# Patient Record
Sex: Male | Born: 1950 | Race: White | Hispanic: No | Marital: Married | Smoking: Former smoker
Health system: Southern US, Community
[De-identification: ages and names within clinical notes are randomized; demographics above are authoritative.]

## PROBLEM LIST (undated history)

## (undated) DIAGNOSIS — Z9109 Other allergy status, other than to drugs and biological substances: Secondary | ICD-10-CM

## (undated) DIAGNOSIS — G4733 Obstructive sleep apnea (adult) (pediatric): Secondary | ICD-10-CM

## (undated) DIAGNOSIS — T7840XA Allergy, unspecified, initial encounter: Secondary | ICD-10-CM

## (undated) DIAGNOSIS — T8859XA Other complications of anesthesia, initial encounter: Secondary | ICD-10-CM

## (undated) DIAGNOSIS — Z86010 Personal history of colonic polyps: Principal | ICD-10-CM

## (undated) DIAGNOSIS — G4730 Sleep apnea, unspecified: Secondary | ICD-10-CM

## (undated) HISTORY — DX: Sleep apnea, unspecified: G47.30

## (undated) HISTORY — PX: WRIST SURGERY: SHX841

## (undated) HISTORY — DX: Allergy, unspecified, initial encounter: T78.40XA

## (undated) HISTORY — DX: Personal history of colonic polyps: Z86.010

## (undated) HISTORY — PX: EYE SURGERY: SHX253

## (undated) HISTORY — DX: Obstructive sleep apnea (adult) (pediatric): G47.33

## (undated) HISTORY — PX: COLONOSCOPY: SHX174

## (undated) HISTORY — DX: Other allergy status, other than to drugs and biological substances: Z91.09

## (undated) HISTORY — PX: FRACTURE SURGERY: SHX138

## (undated) HISTORY — PX: COLON SURGERY: SHX602

---

## 1985-01-25 HISTORY — PX: NASAL SEPTUM SURGERY: SHX37

## 2001-04-20 ENCOUNTER — Ambulatory Visit: Admission: RE | Admit: 2001-04-20 | Discharge: 2001-04-20 | Payer: Self-pay | Admitting: Gastroenterology

## 2001-04-20 ENCOUNTER — Encounter (INDEPENDENT_AMBULATORY_CARE_PROVIDER_SITE_OTHER): Payer: Self-pay | Admitting: Specialist

## 2008-09-04 ENCOUNTER — Encounter: Payer: Self-pay | Admitting: Internal Medicine

## 2008-09-18 ENCOUNTER — Encounter: Payer: Self-pay | Admitting: Internal Medicine

## 2008-09-19 ENCOUNTER — Telehealth (INDEPENDENT_AMBULATORY_CARE_PROVIDER_SITE_OTHER): Payer: Self-pay | Admitting: *Deleted

## 2010-06-12 NOTE — Procedures (Signed)
[facility]  Patient:    Michael Joseph, Michael Joseph Visit Number: [id number] MRN: [id number]          Service Type: Attending:  Petra Kuba, M.D. Dictated by:   Petra Kuba, M.D. Proc. Date: 04/20/01   CC:         Mr. Montey Hora, [place], [place].  Dr. Christell Constant   Procedure Report  PROCEDURE:  Colonoscopy with biopsy.  INDICATIONS FOR PROCEDURE:  Mild change in bowel habits actually better with fiber and some bright red blood per rectum probably due to hemorrhoids.  Consent was signed after risks, benefits, methods, and options were thoroughly discussed in the office.  MEDICINES USED:  Demerol 60, Versed 7.  DESCRIPTION OF PROCEDURE:  Rectal inspection is pertinent for external hemorrhoids, small. The digital exam was negative. The video colonoscope was inserted and easily advanced around the colon to the cecum. On insertion, no abnormalities were seen. The cecum was identified by the ileocecal valve and the appendiceal orifice. In fact, the scope was inserted a short ways into the terminal ileum which was normal. Photo documentation was obtained. The scope was slowly withdrawn. The prep was adequate. There was some liquid stool that required washing and suctioning but on slow withdrawal through the colon adequate visualization was seen. The cecum and ascending were normal. In the mid transverse, a questionable tiny hyperplastic appearing polyp was seen and was cold biopsied x1. The scope was further withdrawn. On the left side of the colon were some very early occasional scattered diverticula. As the scope was withdrawn around the left side of the colon, no other abnormalities were seen. In the rectum, a tiny probably hyperplastic appearing polyp was seen and cold biopsied and put in the same container. The scope was retroflexed back in the rectum pertinent for some internal hemorrhoids. The scope was straightened, readvanced a short ways around the left  side of the colon, air was suctioned, scope removed. The patient tolerated the procedure well. There was on obvious or immediate complication.  ENDOSCOPIC DIAGNOSIS:  1. Internal/external hemorrhoids.  2. Two tiny hyperplastic appearing rectal and transverse polyps cold     biopsied.  3. Very early left sided occasional diverticula.  4. Otherwise within normal limits to the cecum and the terminal ileum.  PLAN:  Await pathology, probably recheck colon screening in five years. Happy to see back p.r.n. otherwise return care to Mr. Webster, Dr. Christell Constant for the customary health care maintenance to include yearly rectals and guaiacs. Dictated by:   Petra Kuba, M.D. Attending:  Petra Kuba, M.D. DD:  04/20/01 TD:  04/21/01 Job: [phone] [id number]

## 2011-03-24 ENCOUNTER — Emergency Department (HOSPITAL_COMMUNITY): Payer: BC Managed Care – PPO

## 2011-03-24 ENCOUNTER — Encounter (HOSPITAL_COMMUNITY): Payer: Self-pay | Admitting: *Deleted

## 2011-03-24 ENCOUNTER — Emergency Department (HOSPITAL_COMMUNITY)
Admission: EM | Admit: 2011-03-24 | Discharge: 2011-03-24 | Disposition: A | Discharge status: Home / Self Care | Payer: BC Managed Care – PPO | Attending: Emergency Medicine | Admitting: Emergency Medicine

## 2011-03-24 DIAGNOSIS — Y92838 Other recreation area as the place of occurrence of the external cause: Secondary | ICD-10-CM | POA: Insufficient documentation

## 2011-03-24 DIAGNOSIS — W010XXA Fall on same level from slipping, tripping and stumbling without subsequent striking against object, initial encounter: Secondary | ICD-10-CM | POA: Insufficient documentation

## 2011-03-24 DIAGNOSIS — S5290XA Unspecified fracture of unspecified forearm, initial encounter for closed fracture: Secondary | ICD-10-CM | POA: Insufficient documentation

## 2011-03-24 DIAGNOSIS — S62102A Fracture of unspecified carpal bone, left wrist, initial encounter for closed fracture: Secondary | ICD-10-CM

## 2011-03-24 DIAGNOSIS — Y9239 Other specified sports and athletic area as the place of occurrence of the external cause: Secondary | ICD-10-CM | POA: Insufficient documentation

## 2011-03-24 IMAGING — CR DG WRIST COMPLETE 3+V*L*
4 series · 4 of 4 positions shown · non-contrast
Comparison: None.

CLINICAL DATA: Left wrist pain and swelling.  Status post fall.

LEFT WRIST - COMPLETE 3+ VIEW

[x wrist pa left]
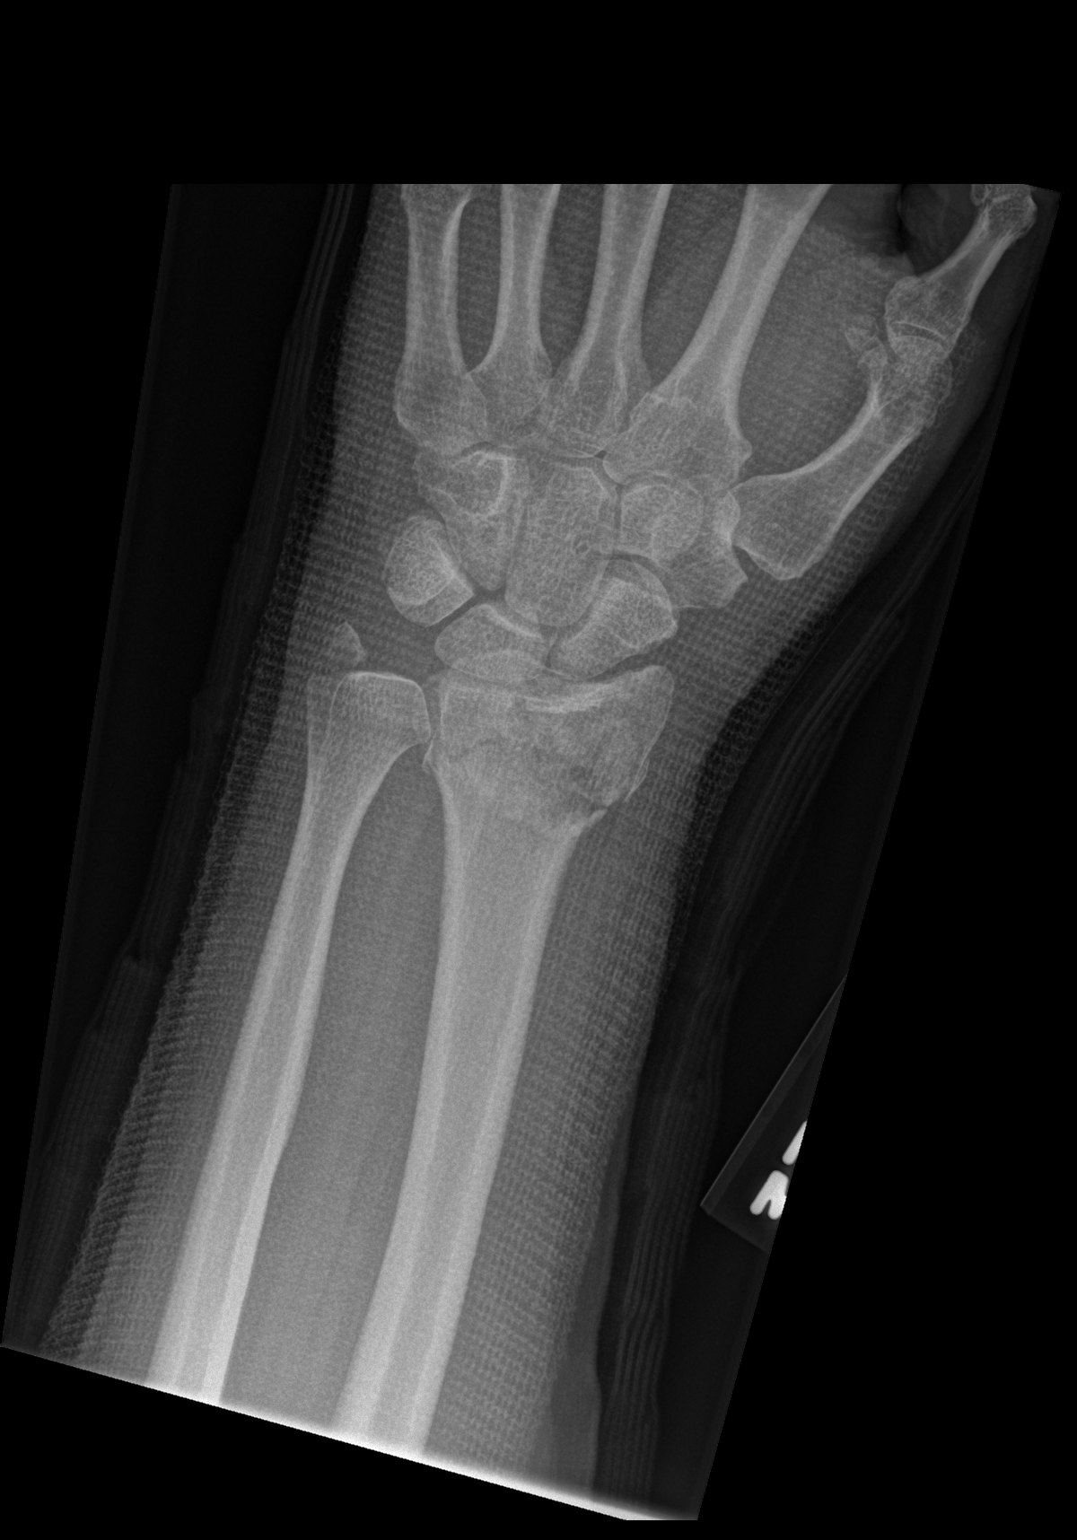

[x wrist obl left]
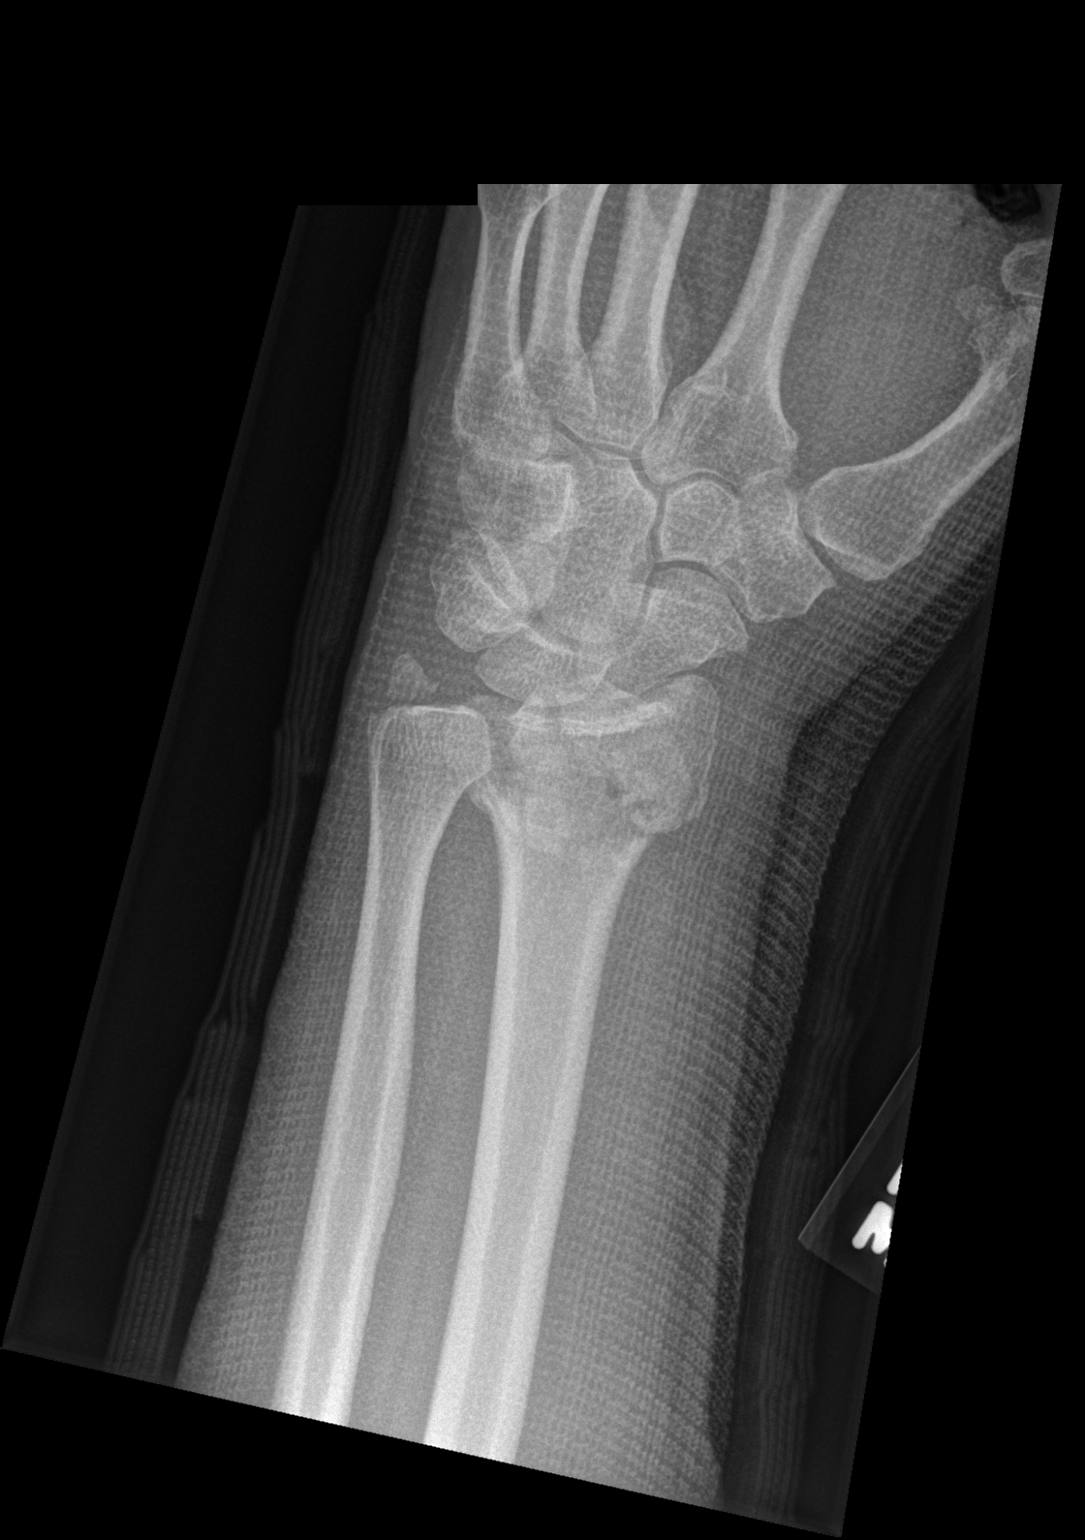

[x wrist lat left]
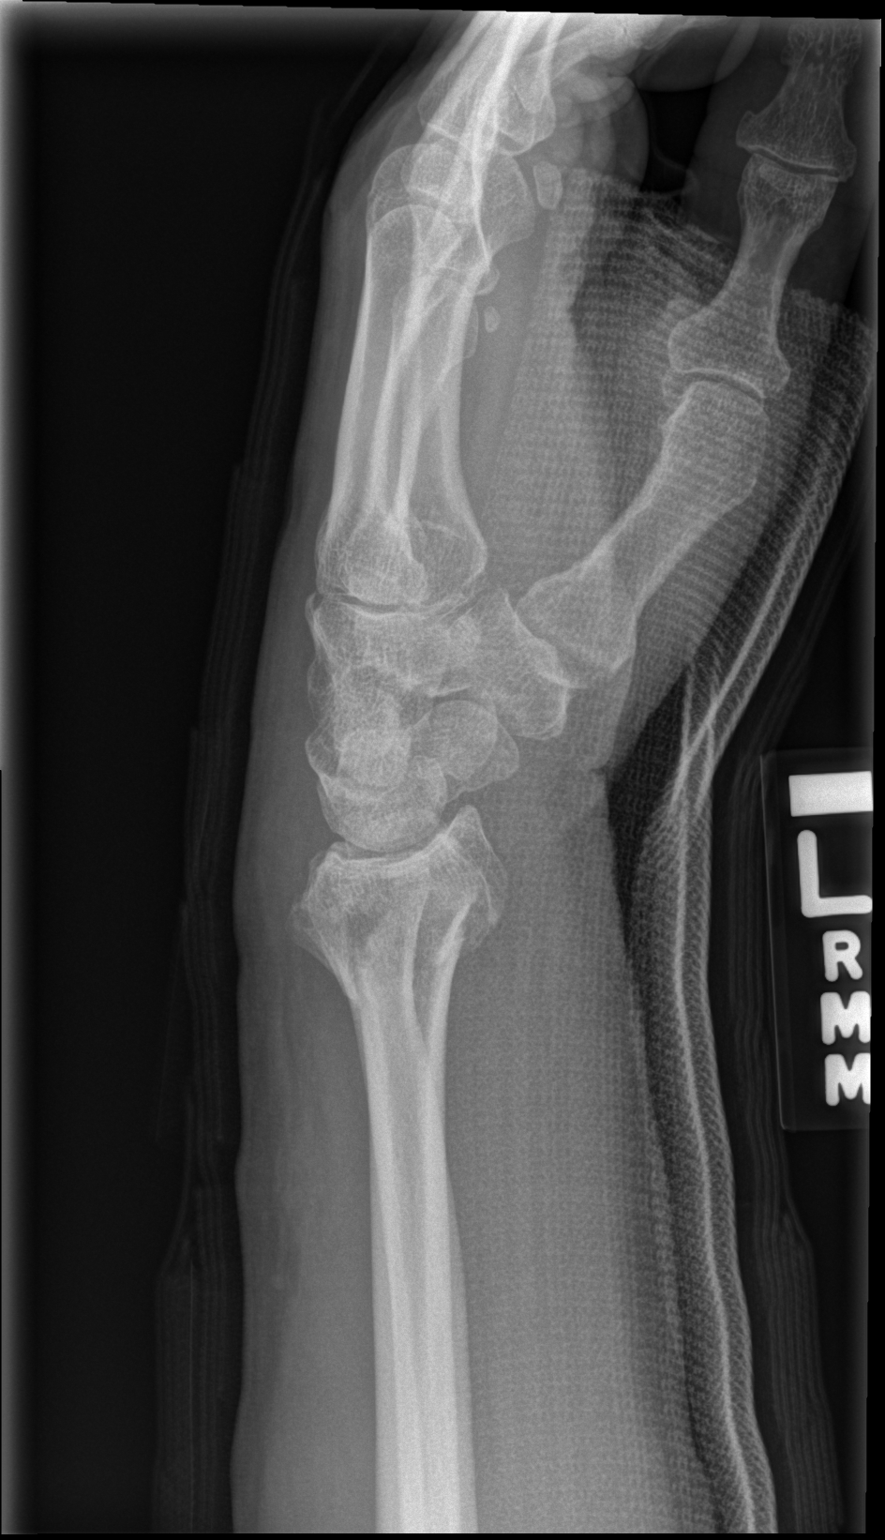

[x wrist navicular view left]
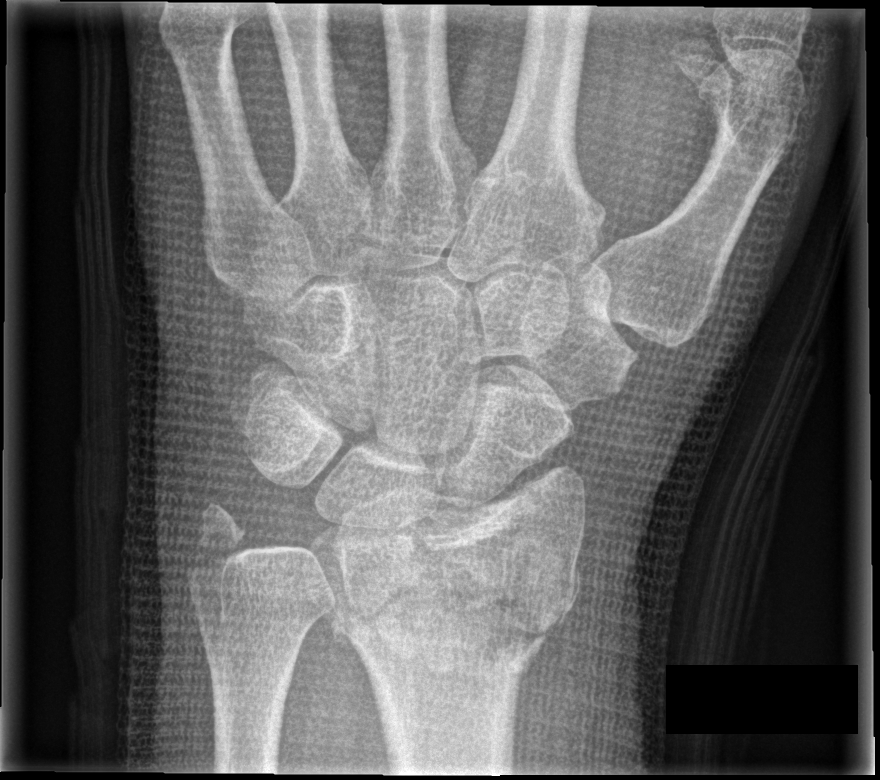

[4 of 4 positions shown; findings below may reference images not displayed]

FINDINGS: There is a significantly comminuted fracture involving
the distal radial metaphysis, with some degree of impaction.
Multiple fracture lines extend to the radiocarpal joint.  There is
only mild displacement of associated fragments, without significant
angulation.  A mildly displaced ulnar styloid fracture is also
seen.

No additional fractures are identified.  The carpal rows appear
grossly intact, and demonstrate normal alignment.  The soft tissues
are difficult to fully characterize due to the overlying bandages.
IMPRESSION: 1.  Significantly comminuted fracture involving the distal radial
metaphysis, with some degree of impaction; mild displacement of
associated fragments, without significant angulation.
2.  Mildly displaced ulnar styloid fracture noted.

## 2011-03-24 MED ORDER — OXYCODONE-ACETAMINOPHEN 5-325 MG PO TABS: 1.0000 | ORAL_TABLET | Freq: Once | ORAL | Status: AC

## 2011-03-24 MED ORDER — OXYCODONE-ACETAMINOPHEN 5-325 MG PO TABS
1.0000 | ORAL_TABLET | Freq: Once | ORAL | Status: AC
  Administered 2011-03-24: 1 via ORAL
  Filled 2011-03-24: qty 1

## 2011-03-24 NOTE — Discharge Instructions (Signed)
Wrist Fracture A wrist fracture is when one or more wrist bones are broken (fractured). A cast or splint is used to keep the injured bones from moving. The cast or splint is often on for 4 to 6 weeks. HOME CARE  Keep the wrist raised (elevated) when sitting or lying down.   Do not wear rings or jewelry on the injured hand or wrist.   Put ice on the injured area.   Put ice in a plastic bag.   Place a towel between your skin and the bag.   Leave the ice on for 15 to 20 minutes, 3 to 4 times a day.   If you are given a plaster or fiberglasscast:   Do not try to scratch the skin under the cast with sharp objects.   Check the skin around the cast every day. You may put lotion on any red or sore areas. Keep the cast dry and clean.   If you are given a plastersplint:   Wear it as told. You may loosen the elastic around the splint if the fingers get numb, tingle, or turn a little blue.   If you are given a removable splint, wear it as told.   Do not use powders or deodorants in or around the cast or splint.   Do not remove padding from your cast or splint.   Do not put pressure on any part of the cast or splint. It may break. Rest the cast or splint on a pillow for the first 24 hours until it is fully hardened.   Gently move your fingers so they do not get stiff.   Do not remove the splint unless your doctor tells you to. Casts must be removed by a doctor.   Only take medicine as told by your doctor.   See your doctor again within 1 week to make sure you are healing well. If you have a splint, you may need a cast after the puffiness (swelling) goes down.  GET HELP RIGHT AWAY IF:   The cast or splint gets damaged or breaks.   The pain gets worse and is not helped by medicine.   There is more puffiness than there was before the cast was put on.   The skin or nails turn blue or gray or feel cold or numb.   The cast or splint feels too tight or too loose.   You have trouble  moving or feeling your fingers.   You have a burning or stinging feeling from the cast or splint.   There is a bad smell coming from the cast or splint.   New stains or fluids are coming from under the cast or splint.   You have any new injuries while wearing the cast or splint.  MAKE SURE YOU:   Understand these instructions.   Will watch your condition.   Will get help right away if you are not doing well or get worse.  Document Released: 06/30/2007 Document Revised: 09/23/2010 Document Reviewed: 06/11/2009 Exit[employer] Patient Information 2012 [employer]. 

## 2011-03-24 NOTE — ED Provider Notes (Signed)
History     CSN: [id number]  Arrival date & time 03/24/11  1836   First MD Initiated Contact with Patient 03/24/11 2156      Chief Complaint  Patient presents with  . Arm Injury    (Consider location/radiation/quality/duration/timing/severity/associated sxs/prior treatment) HPI Comments: Patient slipped by playing golf, catching himself with an outstretched left hand.  Now he has a fracture of the distal radius and ulnar sent from [facility] with a "dislocated wrist" to see Dr. Melvyn Novas from [facility] orthopedics.  He has a ice pack and Ace bandage and sling in place  Patient is a 61 y.o. male presenting with arm injury. The history is provided by the patient.  Arm Injury  The incident occurred today. The injury mechanism was a fall. The injury was related to sports.    History reviewed. No pertinent past medical history.  History reviewed. No pertinent past surgical history.  History reviewed. No pertinent family history.  History  Substance Use Topics  . Smoking status: Not on file  . Smokeless tobacco: Not on file  . Alcohol Use: No      Review of Systems  Constitutional: Negative for chills.  Musculoskeletal: Positive for joint swelling. Negative for back pain.  Skin: Negative for wound.    Allergies  Amoxicillin  Home Medications   Current Outpatient Rx  Name Route Sig Dispense Refill  . FLUTICASONE PROPIONATE 50 MCG/ACT NA SUSP Nasal Place 1 spray into the nose daily.    Marland Kitchen LORATADINE-PSEUDOEPHEDRINE ER 5-120 MG PO TB12 Oral Take 1 tablet by mouth 2 (two) times daily.    . OXYCODONE-ACETAMINOPHEN 5-325 MG PO TABS Oral Take 1 tablet by mouth once. 30 tablet 0    BP 126/78  Pulse 76  Temp(Src) 97.5 F (36.4 C) (Oral)  Resp 20  SpO2 96%  Physical Exam  Constitutional: He appears well-developed and well-nourished.  HENT:  Head: Normocephalic.  Eyes: Pupils are equal, round, and reactive to light.  Neck: Normal range of motion.    Pulmonary/Chest: Effort normal.  Musculoskeletal:       Left forearm and hand in case splint with sling has full range of motion of fingers.  No numbness or tingling.  Refill is less than 3 seconds.  Full range of motion at elbow.  X-ray reveals a comminuted, fracture of the distal radius -- chip fracture of the distal ulnar styloid dressing  Neurological: He is alert.  Skin: Skin is warm and dry.    ED Course  Procedures (including critical care time)  Labs Reviewed - No data to display Dg Wrist Complete Left  03/24/2011  *RADIOLOGY REPORT*  Clinical Data: Left wrist pain and swelling.  Status post fall.  LEFT WRIST - COMPLETE 3+ VIEW  Comparison: None.  Findings: There is a significantly comminuted fracture involving the distal radial metaphysis, with some degree of impaction. Multiple fracture lines extend to the radiocarpal joint.  There is only mild displacement of associated fragments, without significant angulation.  A mildly displaced ulnar styloid fracture is also seen.  No additional fractures are identified.  The carpal rows appear grossly intact, and demonstrate normal alignment.  The soft tissues are difficult to fully characterize due to the overlying bandages.  IMPRESSION:  1.  Significantly comminuted fracture involving the distal radial metaphysis, with some degree of impaction; mild displacement of associated fragments, without significant angulation. 2.  Mildly displaced ulnar styloid fracture noted.  Original Report Authenticated By: Tonia Ghent, M.D.  1. Left wrist fracture     I consulted with Dr. Melvyn Novas, who looked at the x-ray from him.  The beats.  No immediate action at this time.  I will place the patient in a ulnar gutter splint.  Continue pain control and sling and patient will followup with Dr. Melvyn Novas tomorrow in the office  MDM  Comminuted distal left radius fracture and small chip fracture of the distal left ulnar styloid        Arman Filter,  NP 03/24/11 2323  Arman Filter, NP 03/24/11 2324

## 2011-03-24 NOTE — ED Notes (Signed)
Pt seen by EDNP prior to RN assessment, consult with ortho initiated, pt now back from xray, pending results, orders received & initiated, wife at [facility], pain med given. Pain increases with movement, rates 5/10 when still, CMS intact, cap refill <3sec.

## 2011-03-24 NOTE — ED Notes (Signed)
Pt sent here from a [facility], had fall earlier today and has dislocated left wrist, sent here for ortho consult. Has splint and sling applied pta. Cms intact, able to move fingers.

## 2011-03-25 NOTE — ED Provider Notes (Signed)
Medical screening examination/treatment/procedure(s) were performed by non-physician practitioner and as supervising physician I was immediately available for consultation/collaboration.  Raeford Razor, MD 03/25/11 [phone]

## 2012-06-28 ENCOUNTER — Other Ambulatory Visit: Payer: Self-pay | Admitting: Family Medicine

## 2012-06-29 MED ORDER — FLUTICASONE PROPIONATE 50 MCG/ACT NA SUSP: 1.0000 | Freq: Every day | NASAL | Status: DC

## 2012-06-29 NOTE — Telephone Encounter (Signed)
Mail order, call wife at [phone] for pick up

## 2012-12-12 ENCOUNTER — Other Ambulatory Visit: Payer: Self-pay | Admitting: Nurse Practitioner

## 2012-12-14 ENCOUNTER — Telehealth: Payer: Self-pay | Admitting: Nurse Practitioner

## 2012-12-14 MED ORDER — FLUTICASONE PROPIONATE 50 MCG/ACT NA SUSP: 1.0000 | Freq: Every day | NASAL | Status: DC

## 2012-12-14 NOTE — Telephone Encounter (Signed)
Patient aware.

## 2012-12-14 NOTE — Telephone Encounter (Signed)
rx ready for pickup 

## 2012-12-14 NOTE — Telephone Encounter (Signed)
He wants 90 day rx to pickup, last seen 10/22/11 !

## 2013-06-12 ENCOUNTER — Encounter (INDEPENDENT_AMBULATORY_CARE_PROVIDER_SITE_OTHER): Payer: Self-pay

## 2013-06-12 ENCOUNTER — Ambulatory Visit (INDEPENDENT_AMBULATORY_CARE_PROVIDER_SITE_OTHER): Payer: BC Managed Care – PPO | Admitting: Physician Assistant

## 2013-06-12 ENCOUNTER — Encounter: Payer: Self-pay | Admitting: Physician Assistant

## 2013-06-12 VITALS — BP 122/64 | HR 70 | Temp 98.0°F | Ht 67.5 in | Wt 182.4 lb

## 2013-06-12 DIAGNOSIS — Z0000 Encounter for general adult medical examination without abnormal findings: Secondary | ICD-10-CM

## 2013-06-12 DIAGNOSIS — J302 Other seasonal allergic rhinitis: Secondary | ICD-10-CM

## 2013-06-12 DIAGNOSIS — G4733 Obstructive sleep apnea (adult) (pediatric): Secondary | ICD-10-CM | POA: Insufficient documentation

## 2013-06-12 NOTE — Patient Instructions (Signed)

## 2013-06-12 NOTE — Progress Notes (Signed)
Subjective:     Patient ID: Michael Joseph, male   DOB: 07-02-1950, 63 y.o.   MRN: [id number]  HPI Pt needing PE Pt states it has been ~ 5yrs since his last PE visit Voices no concerns or worries  Review of Systems  Constitutional: Negative.   HENT: Negative.   Eyes: Negative.   Respiratory: Negative.   Cardiovascular: Negative.   Gastrointestinal: Negative.   Endocrine: Negative.   Genitourinary: Negative.   Musculoskeletal: Negative.   Skin: Negative.   Allergic/Immunologic: Positive for environmental allergies.  Neurological: Negative.   Hematological: Negative.   Psychiatric/Behavioral: Negative.        Objective:   Physical Exam  Vitals reviewed. Constitutional: He is oriented to person, place, and time. He appears well-developed and well-nourished. No distress.  HENT:  Head: Normocephalic and atraumatic.  Right Ear: External ear normal.  Left Ear: External ear normal.  Nose: Nose normal.  Mouth/Throat: Oropharynx is clear and moist.  Eyes: Conjunctivae and EOM are normal. Pupils are equal, round, and reactive to light.  Neck: Normal range of motion. Neck supple. No JVD present. No thyromegaly present.  Cardiovascular: Normal rate, regular rhythm, normal heart sounds and intact distal pulses.   Pulmonary/Chest: Effort normal and breath sounds normal.  Abdominal: Soft. Bowel sounds are normal. He exhibits no distension and no mass. There is tenderness.  Genitourinary: Penis normal.  Testes down  no ing nodes  Musculoskeletal: Normal range of motion. He exhibits no edema and no tenderness.  Lymphadenopathy:    He has no cervical adenopathy.  Neurological: He is alert and oriented to person, place, and time. He has normal reflexes.  Skin: Skin is warm and dry.  Erythem rash bilat to lower legs Pt with freckles to entire torso Mult small seb keratosis noted to back  Psychiatric: He has a normal mood and affect. His behavior is normal. Judgment and thought  content normal.       Assessment:     Physical exam    Plan:     Pt to continue with good diet and exercise Offered to update tetanus but pt defers Full labs pending- will inform of results Pt needing eye exam but up to date with dentist Pt also overdue for colonoscopy recheck- referral done today F/U pending labs

## 2013-06-13 LAB — CMP14+EGFR
A/G RATIO: 1.8 (ref 1.1–2.5)
ALT: 23 IU/L (ref 0–44)
AST: 22 IU/L (ref 0–40)
Albumin: 4.1 g/dL (ref 3.6–4.8)
Alkaline Phosphatase: 78 IU/L (ref 39–117)
BILIRUBIN TOTAL: 0.5 mg/dL (ref 0.0–1.2)
BUN/Creatinine Ratio: 14 (ref 10–22)
BUN: 12 mg/dL (ref 8–27)
CO2: 22 mmol/L (ref 18–29)
CREATININE: 0.83 mg/dL (ref 0.76–1.27)
Calcium: 9.1 mg/dL (ref 8.6–10.2)
Chloride: 105 mmol/L (ref 97–108)
GFR, EST AFRICAN AMERICAN: 110 mL/min/{1.73_m2} (ref >59)
GFR, EST NON AFRICAN AMERICAN: 95 mL/min/{1.73_m2} (ref >59)
GLOBULIN, TOTAL: 2.3 g/dL (ref 1.5–4.5)
Glucose: 108 mg/dL — ABNORMAL HIGH (ref 65–99)
POTASSIUM: 4.4 mmol/L (ref 3.5–5.2)
SODIUM: 139 mmol/L (ref 134–144)
TOTAL PROTEIN: 6.4 g/dL (ref 6.0–8.5)

## 2013-06-13 LAB — LIPID PANEL
CHOL/HDL RATIO: 6.1 ratio — AB (ref 0.0–5.0)
Cholesterol, Total: 196 mg/dL (ref 100–199)
HDL: 32 mg/dL — ABNORMAL LOW (ref >39)
LDL Calculated: 102 mg/dL — ABNORMAL HIGH (ref 0–99)
Triglycerides: 308 mg/dL — ABNORMAL HIGH (ref 0–149)
VLDL CHOLESTEROL CAL: 62 mg/dL — AB (ref 5–40)

## 2013-06-13 LAB — PSA, TOTAL AND FREE
PSA, Free Pct: 23.3 %
PSA, Free: 0.63 ng/mL
PSA: 2.7 ng/mL (ref 0.0–4.0)

## 2013-06-13 LAB — HEPATIC FUNCTION PANEL: BILIRUBIN DIRECT: 0.14 mg/dL (ref 0.00–0.40)

## 2013-06-27 ENCOUNTER — Encounter: Payer: Self-pay | Admitting: Internal Medicine

## 2013-08-13 ENCOUNTER — Ambulatory Visit (AMBULATORY_SURGERY_CENTER): Payer: Self-pay

## 2013-08-13 VITALS — Ht 68.0 in | Wt 180.0 lb

## 2013-08-13 DIAGNOSIS — Z860100 Personal history of colon polyps, unspecified: Secondary | ICD-10-CM

## 2013-08-13 MED ORDER — SUPREP BOWEL PREP KIT 17.5-3.13-1.6 GM/177ML PO SOLN: 1.0000 | Freq: Once | ORAL | Status: DC

## 2013-08-13 NOTE — Progress Notes (Signed)
No allergies to eggs or soy No home oxygen No past problem with anesthesia No diet/weight loss meds  Has email  Emmi instructions given for colonoscopy

## 2013-08-28 ENCOUNTER — Encounter: Payer: Self-pay | Admitting: Internal Medicine

## 2013-08-28 ENCOUNTER — Ambulatory Visit (AMBULATORY_SURGERY_CENTER): Payer: BC Managed Care – PPO | Admitting: Internal Medicine

## 2013-08-28 ENCOUNTER — Encounter: Payer: BC Managed Care – PPO | Admitting: Internal Medicine

## 2013-08-28 VITALS — BP 99/62 | HR 60 | Temp 97.0°F | Resp 12 | Ht 68.0 in | Wt 180.0 lb

## 2013-08-28 DIAGNOSIS — Z860100 Personal history of colon polyps, unspecified: Secondary | ICD-10-CM

## 2013-08-28 DIAGNOSIS — D126 Benign neoplasm of colon, unspecified: Secondary | ICD-10-CM

## 2013-08-28 HISTORY — DX: Personal history of colonic polyps: Z86.010

## 2013-08-28 HISTORY — DX: Personal history of colon polyps, unspecified: Z86.0100

## 2013-08-28 MED ORDER — SODIUM CHLORIDE 0.9 % IV SOLN: 500.0000 mL | INTRAVENOUS | Status: DC

## 2013-08-28 NOTE — Op Note (Signed)
[place]  [address]. [place], [address]   COLONOSCOPY PROCEDURE REPORT  PATIENT: Michael Joseph, Michael Joseph  MR#: [id number] BIRTHDATE: [birth year] , 15  yrs. old GENDER: Male ENDOSCOPIST: Gatha Mayer, MD, [facility] PROCEDURE DATE:  08/28/2013 PROCEDURE:   Colonoscopy with biopsy and snare polypectomy First Screening Colonoscopy - Avg.  risk and is 50 yrs.  old or older - No.  Prior Negative Screening - Now for repeat screening. N/A  History of Adenoma - Now for follow-up colonoscopy & has been > or = to 3 yrs.  Yes hx of adenoma.  Has been 3 or more years since last colonoscopy.  Polyps Removed Today? Yes. ASA CLASS:   Class II INDICATIONS:Patient's personal history of adenomatous colon polyps.  MEDICATIONS: propofol (Diprivan) 200mg  IV, MAC sedation, administered by CRNA, and These medications were titrated to patient response per physician's verbal order  DESCRIPTION OF PROCEDURE:   After the risks benefits and alternatives of the procedure were thoroughly explained, informed consent was obtained.  A digital rectal exam revealed no abnormalities of the rectum.   The LB [id number]  endoscope was introduced through the anus and advanced to the cecum, which was identified by both the appendix and ileocecal valve. No adverse events experienced.   The quality of the prep was excellent using Suprep  The instrument was then slowly withdrawn as the colon was fully examined.      COLON FINDINGS: Two diminutive sessile polyps were found in the ascending colon and sigmoid colon.  A polypectomy was performed with cold forceps and with a cold snare.  The resection was complete and the polyp tissue was completely retrieved.   The colon mucosa was otherwise normal.  Retroflexed views revealed no abnormalities. The time to cecum=1 minutes 47 seconds.  Withdrawal time=8 minutes 13 seconds.  The scope was withdrawn and the procedure completed. COMPLICATIONS: There were  no complications.  ENDOSCOPIC IMPRESSION: 1.   Two diminutive sessile polyps were found in the ascending colon and sigmoid colon; polypectomy was performed with cold forceps and with a cold snare 2.   The colon mucosa was otherwise normal  RECOMMENDATIONS: Timing of repeat colonoscopy will be determined by pathology findings.   eSigned:  Gatha Mayer, MD, [facility] 08/28/2013 1:57 PM   cc: The Patient  and Thomasene Mohair, PA-C

## 2013-08-28 NOTE — Progress Notes (Signed)
Called to room to assist during endoscopic procedure.  Patient ID and intended procedure confirmed with present staff. Received instructions for my participation in the procedure from the performing physician.  

## 2013-08-28 NOTE — Progress Notes (Signed)
A/ox3, pleased with MAC, report to RN 

## 2013-08-28 NOTE — Patient Instructions (Signed)
YOU HAD AN ENDOSCOPIC PROCEDURE TODAY AT THE [facility]: Refer to the procedure report that was given to you for any specific questions about what was found during the examination.  If the procedure report does not answer your questions, please call your gastroenterologist to clarify.  If you requested that your care partner not be given the details of your procedure findings, then the procedure report has been included in a sealed envelope for you to review at your convenience later.  YOU SHOULD EXPECT: Some feelings of bloating in the abdomen. Passage of more gas than usual.  Walking can help get rid of the air that was put into your GI tract during the procedure and reduce the bloating. If you had a lower endoscopy (such as a colonoscopy or flexible sigmoidoscopy) you may notice spotting of blood in your stool or on the toilet paper. If you underwent a bowel prep for your procedure, then you may not have a normal bowel movement for a few days.  DIET: Your first meal following the procedure should be a light meal and then it is ok to progress to your normal diet.  A half-sandwich or bowl of soup is an example of a good first meal.  Heavy or fried foods are harder to digest and may make you feel nauseous or bloated.  Likewise meals heavy in dairy and vegetables can cause extra gas to form and this can also increase the bloating.  Drink plenty of fluids but you should avoid alcoholic beverages for 24 hours.  ACTIVITY: Your care partner should take you home directly after the procedure.  You should plan to take it easy, moving slowly for the rest of the day.  You can resume normal activity the day after the procedure however you should NOT DRIVE or use heavy machinery for 24 hours (because of the sedation medicines used during the test).    SYMPTOMS TO REPORT IMMEDIATELY: A gastroenterologist can be reached at any hour.  During normal business hours, 8:30 AM to 5:00 PM Monday through Friday,  call [phone].  After hours and on weekends, please call the GI answering service at [phone] who will take a message and have the physician on call contact you.   Following lower endoscopy (colonoscopy or flexible sigmoidoscopy):  Excessive amounts of blood in the stool  Significant tenderness or worsening of abdominal pains  Swelling of the abdomen that is new, acute  Fever of 100F or higher    FOLLOW UP: If any biopsies were taken you will be contacted by phone or by letter within the next 1-3 weeks.  Call your gastroenterologist if you have not heard about the biopsies in 3 weeks.  Our staff will call the home number listed on your records the next business day following your procedure to check on you and address any questions or concerns that you may have at that time regarding the information given to you following your procedure. This is a courtesy call and so if there is no answer at the home number and we have not heard from you through the emergency physician on call, we will assume that you have returned to your regular daily activities without incident.  SIGNATURES/CONFIDENTIALITY: You and/or your care partner have signed paperwork which will be entered into your electronic medical record.  These signatures attest to the fact that that the information above on your After Visit Summary has been reviewed and is understood.  Full responsibility of the confidentiality   of this discharge information lies with you and/or your care-partner.     

## 2013-08-29 ENCOUNTER — Telehealth: Payer: Self-pay

## 2013-08-29 NOTE — Telephone Encounter (Signed)
  Follow up Call-  Call back number 08/28/2013  Post procedure Call Back phone  # [phone]  Permission to leave phone message Yes     Patient questions:  Do you have a fever, pain , or abdominal swelling? No. Pain Score  0 *  Have you tolerated food without any problems? Yes.    Have you been able to return to your normal activities? Yes.    Do you have any questions about your discharge instructions: Diet   No. Medications  No. Follow up visit  No.  Do you have questions or concerns about your Care? No.  Actions: * If pain score is 4 or above: No action needed, pain <4.

## 2013-09-04 ENCOUNTER — Encounter: Payer: Self-pay | Admitting: Internal Medicine

## 2013-09-04 NOTE — Progress Notes (Signed)
Quick Note:  1 diminutive adenoma and 1 lymphoid polyp Repeat colon about 2020 ______

## 2013-10-08 ENCOUNTER — Ambulatory Visit (INDEPENDENT_AMBULATORY_CARE_PROVIDER_SITE_OTHER): Payer: BC Managed Care – PPO | Admitting: Family Medicine

## 2013-10-08 ENCOUNTER — Encounter: Payer: Self-pay | Admitting: Family Medicine

## 2013-10-08 VITALS — BP 130/77 | HR 67 | Temp 97.1°F | Ht 67.5 in | Wt 178.0 lb

## 2013-10-08 DIAGNOSIS — T148 Other injury of unspecified body region: Secondary | ICD-10-CM

## 2013-10-08 DIAGNOSIS — W57XXXA Bitten or stung by nonvenomous insect and other nonvenomous arthropods, initial encounter: Secondary | ICD-10-CM

## 2013-10-08 MED ORDER — DOXYCYCLINE HYCLATE 100 MG PO TABS: 100.0000 mg | ORAL_TABLET | Freq: Two times a day (BID) | ORAL | Status: DC

## 2013-10-08 NOTE — Progress Notes (Signed)
   Subjective:    Patient ID: FOTIOS AMOS, male    DOB: [birth year], 63 y.o.   MRN: [id number]  HPI This 63 y.o. male presents for evaluation of rash on left shoulder after tick bite.   Review of Systems C/o tick bite on left shoulder and rash No chest pain, SOB, HA, dizziness, vision change, N/V, diarrhea, constipation, dysuria, urinary urgency or frequency, myalgias, arthralgias.     Objective:   Physical Exam  Vital signs noted  Well developed well nourished male.  HEENT - Head atraumatic Normocephalic Respiratory - Lungs CTA bilateral Cardiac - RRR S1 and S2 without murmur GI - Abdomen soft Nontender and bowel sounds active x 4 Extremities - No edema. Neuro - Grossly intact. Skin - Erythema 5-6 cm diameter with central erythema     Assessment & Plan:  Tick bite - Plan: doxycycline (VIBRA-TABS) 100 MG tablet, Lyme Ab/Western Blot Reflex, Rocky mtn spotted fvr abs pnl(IgG+IgM)  Lysbeth Penner FNP

## 2013-10-11 LAB — ROCKY MTN SPOTTED FVR ABS PNL(IGG+IGM)
RMSF IgG: POSITIVE — AB
RMSF IgM: 1.03 index — ABNORMAL HIGH (ref 0.00–0.89)

## 2013-10-11 LAB — LYME AB/WESTERN BLOT REFLEX
LYME DISEASE AB, QUANT, IGM: <0.8 index (ref 0.00–0.79)
Lyme IgG/IgM Ab: <0.91 {ISR} (ref 0.00–0.90)

## 2013-10-11 LAB — RMSF, IGG, IFA: RMSF, IGG, IFA: 1:256 {titer} — ABNORMAL HIGH

## 2014-02-26 ENCOUNTER — Telehealth: Payer: Self-pay | Admitting: Family Medicine

## 2014-02-28 NOTE — Telephone Encounter (Signed)
He will call and make appointment for ov, needs sleep study.

## 2014-03-19 ENCOUNTER — Telehealth: Payer: Self-pay

## 2014-03-19 NOTE — Telephone Encounter (Signed)
Needs a referral for Sleep study at [facility] for new CPAP machine

## 2014-03-26 NOTE — Telephone Encounter (Signed)
Detailed message left that patient will need to be seen for a referral for sleep study.

## 2014-04-01 ENCOUNTER — Encounter: Payer: Self-pay | Admitting: Family Medicine

## 2014-04-01 ENCOUNTER — Ambulatory Visit (INDEPENDENT_AMBULATORY_CARE_PROVIDER_SITE_OTHER): Payer: BLUE CROSS/BLUE SHIELD | Admitting: Family Medicine

## 2014-04-01 VITALS — BP 120/70 | HR 75 | Temp 97.9°F | Ht 67.5 in | Wt 179.0 lb

## 2014-04-01 DIAGNOSIS — G4733 Obstructive sleep apnea (adult) (pediatric): Secondary | ICD-10-CM

## 2014-04-01 DIAGNOSIS — Z9989 Dependence on other enabling machines and devices: Principal | ICD-10-CM

## 2014-04-01 MED ORDER — FLUTICASONE PROPIONATE 50 MCG/ACT NA SUSP: 2.0000 | Freq: Every day | NASAL | Status: DC

## 2014-04-01 NOTE — Progress Notes (Signed)
Subjective:  Patient ID: Michael Joseph, male    DOB: [birth year]  Age: 64 y.o. MRN: [id number]  CC: Sleep Apnea   HPI Michael Joseph presents for recheck of the sleep apnea. He has not had a sleep apnea test in 12 years. He says that when he first started his treatment that he was in a brain fog for a good bit of the day particularly in the mornings. After being on the treatment short time that went away. Now his wife is concerned that the mask is leaking and the machine sounds funny. The patient states that the brain fog has returned. He would like to be reevaluated. His current machine is 63 years old as is the mask and tubing.   History Michael Joseph has a past medical history of Environmental allergies and Personal history of colonic polyps - adenoma (08/28/2013).   He has past surgical history that includes Wrist surgery (Left) and Nasal septum surgery (1987).   His family history includes Diabetes in his mother. There is no history of Colon cancer, Pancreatic cancer, Rectal cancer, or Stomach cancer.He reports that he quit smoking about 11 years ago. His smoking use included Pipe. He does not have any smokeless tobacco history on file. He reports that he does not drink alcohol or use illicit drugs.  Current Outpatient Prescriptions on File Prior to Visit  Medication Sig Dispense Refill  . loratadine-pseudoephedrine (CLARITIN-D 12-HOUR) 5-120 MG per tablet Take 1 tablet by mouth 2 (two) times daily.     No current facility-administered medications on file prior to visit.    ROS Review of Systems  Constitutional: Negative for fever, chills, diaphoresis and unexpected weight change.  HENT: Negative for congestion, hearing loss, rhinorrhea, sore throat and trouble swallowing.   Respiratory: Negative for cough, chest tightness, shortness of breath and wheezing.   Gastrointestinal: Negative for nausea, vomiting, abdominal pain, diarrhea, constipation and abdominal distention.  Endocrine:  Negative for cold intolerance and heat intolerance.  Genitourinary: Negative for dysuria, hematuria and flank pain.  Musculoskeletal: Negative for joint swelling and arthralgias.  Skin: Negative for rash.  Neurological: Negative for dizziness and headaches.  Psychiatric/Behavioral: Negative for dysphoric mood, decreased concentration and agitation. The patient is not nervous/anxious.     Objective:  BP 120/70 mmHg  Pulse 75  Temp(Src) 97.9 F (36.6 C) (Oral)  Ht 5' 7.5" (1.715 m)  Wt 179 lb (81.194 kg)  BMI 27.61 kg/m2  BP Readings from Last 3 Encounters:  04/01/14 120/70  10/08/13 130/77  08/28/13 99/62    Wt Readings from Last 3 Encounters:  04/01/14 179 lb (81.194 kg)  10/08/13 178 lb (80.74 kg)  08/28/13 180 lb (81.647 kg)     Physical Exam  Constitutional: He appears well-developed and well-nourished.  HENT:  Head: Normocephalic and atraumatic.  Right Ear: Tympanic membrane and external ear normal. No decreased hearing is noted.  Left Ear: Tympanic membrane and external ear normal. No decreased hearing is noted.  Nose: Mucosal edema present. Right sinus exhibits no frontal sinus tenderness. Left sinus exhibits no frontal sinus tenderness.  Mouth/Throat: No oropharyngeal exudate or posterior oropharyngeal erythema.  Neck: No Brudzinski's sign noted.  Pulmonary/Chest: Breath sounds normal. No respiratory distress.  Lymphadenopathy:       Head (right side): No preauricular adenopathy present.       Head (left side): No preauricular adenopathy present.       Right cervical: No superficial cervical adenopathy present.      Left cervical: No  superficial cervical adenopathy present.    No results found for: HGBA1C  Lab Results  Component Value Date   GLUCOSE 108* 06/12/2013   CHOL 196 06/12/2013   TRIG 308* 06/12/2013   HDL 32* 06/12/2013   LDLCALC 102* 06/12/2013   ALT 23 06/12/2013   AST 22 06/12/2013   NA 139 06/12/2013   K 4.4 06/12/2013   CL 105  06/12/2013   CREATININE 0.83 06/12/2013   BUN 12 06/12/2013   CO2 22 06/12/2013   PSA 2.7 06/12/2013    Dg Wrist Complete Left  03/24/2011   *RADIOLOGY REPORT*  Clinical Data: Left wrist pain and swelling.  Status post fall.  LEFT WRIST - COMPLETE 3+ VIEW  Comparison: None.  Findings: There is a significantly comminuted fracture involving the distal radial metaphysis, with some degree of impaction. Multiple fracture lines extend to the radiocarpal joint.  There is only mild displacement of associated fragments, without significant angulation.  A mildly displaced ulnar styloid fracture is also seen.  No additional fractures are identified.  The carpal rows appear grossly intact, and demonstrate normal alignment.  The soft tissues are difficult to fully characterize due to the overlying bandages.  IMPRESSION:  1.  Significantly comminuted fracture involving the distal radial metaphysis, with some degree of impaction; mild displacement of associated fragments, without significant angulation. 2.  Mildly displaced ulnar styloid fracture noted.  Original Report Authenticated By: Santa Lighter, M.D.   Assessment & Plan:   Michael Joseph was seen today for sleep apnea.  Diagnoses and all orders for this visit:  OSA on CPAP Orders: -     Ambulatory referral to Sleep Studies  Other orders -     fluticasone (FLONASE) 50 MCG/ACT nasal spray; Place 2 sprays into both nostrils daily.  I have discontinued Michael Joseph's doxycycline. I have also changed his fluticasone. Additionally, I am having him maintain his loratadine-pseudoephedrine.  Meds ordered this encounter  Medications  . fluticasone (FLONASE) 50 MCG/ACT nasal spray    Sig: Place 2 sprays into both nostrils daily.    Dispense:  48 g    Refill:  11     Follow-up: No Follow-up on file.  Claretta Fraise, M.D.

## 2015-09-01 ENCOUNTER — Other Ambulatory Visit: Payer: Self-pay | Admitting: Family Medicine

## 2016-01-23 ENCOUNTER — Encounter (INDEPENDENT_AMBULATORY_CARE_PROVIDER_SITE_OTHER): Payer: Self-pay

## 2016-01-23 ENCOUNTER — Ambulatory Visit (INDEPENDENT_AMBULATORY_CARE_PROVIDER_SITE_OTHER): Payer: BLUE CROSS/BLUE SHIELD | Admitting: Family Medicine

## 2016-01-23 ENCOUNTER — Encounter: Payer: Self-pay | Admitting: Family Medicine

## 2016-01-23 VITALS — BP 125/73 | HR 73 | Temp 97.2°F | Ht 67.5 in | Wt 189.4 lb

## 2016-01-23 DIAGNOSIS — Z125 Encounter for screening for malignant neoplasm of prostate: Secondary | ICD-10-CM

## 2016-01-23 DIAGNOSIS — Z114 Encounter for screening for human immunodeficiency virus [HIV]: Secondary | ICD-10-CM

## 2016-01-23 DIAGNOSIS — E663 Overweight: Secondary | ICD-10-CM | POA: Insufficient documentation

## 2016-01-23 DIAGNOSIS — Z0000 Encounter for general adult medical examination without abnormal findings: Secondary | ICD-10-CM | POA: Diagnosis not present

## 2016-01-23 DIAGNOSIS — Z13220 Encounter for screening for lipoid disorders: Secondary | ICD-10-CM

## 2016-01-23 DIAGNOSIS — Z131 Encounter for screening for diabetes mellitus: Secondary | ICD-10-CM

## 2016-01-23 DIAGNOSIS — Z1159 Encounter for screening for other viral diseases: Secondary | ICD-10-CM

## 2016-01-23 NOTE — Progress Notes (Signed)
BP 125/73   Pulse 73   Temp 97.2 F (36.2 C) (Oral)   Ht 5' 7.5" (1.715 m)   Wt 189 lb 6 oz (85.9 kg)   BMI 29.22 kg/m    Subjective:    Patient ID: Michael Joseph, male    DOB: [birth year], 65 y.o.   MRN: [id number]  HPI: Michael Joseph is a 65 y.o. male presenting on 01/23/2016 for Annual Exam (patient is not fasting)   HPI Patient is coming in for well adult exam and annual physical Patient is coming in today for annual well adult exam and physical. He does have sleep apnea and has been using the machine consistently but otherwise has no other health issues that he knows of. He denies any chest pain, shortness of breath, headaches or vision issues, abdominal complaints, diarrhea, nausea, vomiting, or joint issues.   Relevant past medical, surgical, family and social history reviewed and updated as indicated. Interim medical history since our last visit reviewed. Allergies and medications reviewed and updated.  Review of Systems  Constitutional: Negative for chills and fever.  HENT: Negative for ear pain and tinnitus.   Eyes: Negative for pain.  Respiratory: Negative for cough, shortness of breath and wheezing.   Cardiovascular: Negative for chest pain, palpitations and leg swelling.  Gastrointestinal: Negative for abdominal pain, blood in stool, constipation and diarrhea.  Genitourinary: Negative for dysuria and hematuria.  Musculoskeletal: Negative for back pain and myalgias.  Skin: Negative for rash.  Neurological: Negative for dizziness, weakness and headaches.  Psychiatric/Behavioral: Negative for suicidal ideas.    Per HPI unless specifically indicated above   Allergies as of 01/23/2016      Reactions   Amoxicillin    Skin rash      Medication List       Accurate as of 01/23/16 11:39 AM. Always use your most recent med list.          fluticasone 50 MCG/ACT nasal spray Commonly known as:  FLONASE USE 2 SPRAYS IN EACH NOSTRIL DAILY     loratadine-pseudoephedrine 5-120 MG tablet Commonly known as:  CLARITIN-D 12-hour Take 1 tablet by mouth 2 (two) times daily.          Objective:    BP 125/73   Pulse 73   Temp 97.2 F (36.2 C) (Oral)   Ht 5' 7.5" (1.715 m)   Wt 189 lb 6 oz (85.9 kg)   BMI 29.22 kg/m   Wt Readings from Last 3 Encounters:  01/23/16 189 lb 6 oz (85.9 kg)  04/01/14 179 lb (81.2 kg)  10/08/13 178 lb (80.7 kg)    Physical Exam  Constitutional: He is oriented to person, place, and time. He appears well-developed and well-nourished. No distress.  HENT:  Right Ear: External ear normal.  Left Ear: External ear normal.  Nose: Nose normal.  Mouth/Throat: Oropharynx is clear and moist. No oropharyngeal exudate.  Eyes: Conjunctivae and EOM are normal. Pupils are equal, round, and reactive to light. Right eye exhibits no discharge. No scleral icterus.  Neck: Neck supple. No thyromegaly present.  Cardiovascular: Normal rate, regular rhythm, normal heart sounds and intact distal pulses.   No murmur heard. Pulmonary/Chest: Effort normal and breath sounds normal. No respiratory distress. He has no wheezes.  Abdominal: Soft. Bowel sounds are normal. He exhibits no distension. There is no tenderness. There is no rebound and no guarding.  Genitourinary:  Genitourinary Comments: Patient declined exam today  Musculoskeletal: Normal range of motion. He  exhibits no edema.  Lymphadenopathy:    He has no cervical adenopathy.  Neurological: He is alert and oriented to person, place, and time. Coordination normal.  Skin: Skin is warm and dry. No rash noted. He is not diaphoretic.  Psychiatric: He has a normal mood and affect. His behavior is normal.  Vitals reviewed.     Assessment & Plan:   Problem List Items Addressed This Visit      Other   Overweight (BMI 25.0-29.9)   Relevant Orders   CBC with Differential/Platelet    Other Visit Diagnoses    Well adult exam    -  Primary   Relevant Orders   CBC  with Differential/Platelet   CMP14+EGFR   Lipid panel   HIV antibody   Hepatitis C antibody   PSA, total and free   Need for hepatitis C screening test       Relevant Orders   Hepatitis C antibody   Screening for HIV without presence of risk factors       Relevant Orders   HIV antibody   Lipid screening       Relevant Orders   Lipid panel   Diabetes mellitus screening       Relevant Orders   CMP14+EGFR   Prostate cancer screening       Relevant Orders   PSA, total and free       Follow up plan: Return in about 1 year (around 01/22/2017), or if symptoms worsen or fail to improve.  Counseling provided for all of the vaccine components Orders Placed This Encounter  Procedures  . CBC with Differential/Platelet  . CMP14+EGFR  . Lipid panel  . HIV antibody  . Hepatitis C antibody  . PSA, total and free    Caryl Pina, MD [facility] 01/23/2016, 11:39 AM

## 2016-01-24 LAB — CMP14+EGFR
A/G RATIO: 1.4 (ref 1.2–2.2)
ALT: 30 IU/L (ref 0–44)
AST: 24 IU/L (ref 0–40)
Albumin: 4.1 g/dL (ref 3.6–4.8)
Alkaline Phosphatase: 83 IU/L (ref 39–117)
BILIRUBIN TOTAL: 0.3 mg/dL (ref 0.0–1.2)
BUN/Creatinine Ratio: 18 (ref 10–24)
BUN: 13 mg/dL (ref 8–27)
CALCIUM: 9.3 mg/dL (ref 8.6–10.2)
CHLORIDE: 102 mmol/L (ref 96–106)
CO2: 20 mmol/L (ref 18–29)
Creatinine, Ser: 0.72 mg/dL — ABNORMAL LOW (ref 0.76–1.27)
GFR calc non Af Amer: 99 mL/min/{1.73_m2} (ref >59)
GFR, EST AFRICAN AMERICAN: 114 mL/min/{1.73_m2} (ref >59)
GLUCOSE: 106 mg/dL — AB (ref 65–99)
Globulin, Total: 2.9 g/dL (ref 1.5–4.5)
POTASSIUM: 4.5 mmol/L (ref 3.5–5.2)
Sodium: 141 mmol/L (ref 134–144)
TOTAL PROTEIN: 7 g/dL (ref 6.0–8.5)

## 2016-01-24 LAB — LIPID PANEL
Chol/HDL Ratio: 6.7 ratio units — ABNORMAL HIGH (ref 0.0–5.0)
Cholesterol, Total: 233 mg/dL — ABNORMAL HIGH (ref 100–199)
HDL: 35 mg/dL — AB (ref >39)
TRIGLYCERIDES: 524 mg/dL — AB (ref 0–149)

## 2016-01-24 LAB — PSA, TOTAL AND FREE
PROSTATE SPECIFIC AG, SERUM: 3.2 ng/mL (ref 0.0–4.0)
PSA FREE PCT: 16.9 %
PSA FREE: 0.54 ng/mL

## 2016-01-24 LAB — CBC WITH DIFFERENTIAL/PLATELET
BASOS ABS: 0 10*3/uL (ref 0.0–0.2)
Basos: 1 %
EOS (ABSOLUTE): 0.3 10*3/uL (ref 0.0–0.4)
Eos: 5 %
Hematocrit: 46.3 % (ref 37.5–51.0)
Hemoglobin: 16 g/dL (ref 13.0–17.7)
IMMATURE GRANS (ABS): 0 10*3/uL (ref 0.0–0.1)
IMMATURE GRANULOCYTES: 0 %
LYMPHS: 30 %
Lymphocytes Absolute: 1.7 10*3/uL (ref 0.7–3.1)
MCH: 28.8 pg (ref 26.6–33.0)
MCHC: 34.6 g/dL (ref 31.5–35.7)
MCV: 83 fL (ref 79–97)
MONOS ABS: 0.6 10*3/uL (ref 0.1–0.9)
Monocytes: 10 %
NEUTROS PCT: 54 %
Neutrophils Absolute: 3.1 10*3/uL (ref 1.4–7.0)
PLATELETS: 283 10*3/uL (ref 150–379)
RBC: 5.55 x10E6/uL (ref 4.14–5.80)
RDW: 13.2 % (ref 12.3–15.4)
WBC: 5.6 10*3/uL (ref 3.4–10.8)

## 2016-01-24 LAB — HEPATITIS C ANTIBODY: Hep C Virus Ab: <0.1 s/co ratio (ref 0.0–0.9)

## 2016-01-24 LAB — HIV ANTIBODY (ROUTINE TESTING W REFLEX): HIV Screen 4th Generation wRfx: NONREACTIVE

## 2016-01-29 ENCOUNTER — Telehealth: Payer: Self-pay | Admitting: Family Medicine

## 2016-01-29 NOTE — Telephone Encounter (Signed)
Left message for patient to call.

## 2016-01-30 LAB — HGB A1C W/O EAG: Hgb A1c MFr Bld: 5.7 % — ABNORMAL HIGH (ref 4.8–5.6)

## 2016-01-30 LAB — SPECIMEN STATUS REPORT

## 2016-02-02 NOTE — Telephone Encounter (Signed)
Pt was made aware of results by Georgina Pillion 02/02/16 will close encounter.

## 2017-06-30 ENCOUNTER — Encounter: Payer: Self-pay | Admitting: Physician Assistant

## 2017-06-30 ENCOUNTER — Ambulatory Visit (INDEPENDENT_AMBULATORY_CARE_PROVIDER_SITE_OTHER): Payer: BLUE CROSS/BLUE SHIELD | Admitting: Physician Assistant

## 2017-06-30 ENCOUNTER — Ambulatory Visit (INDEPENDENT_AMBULATORY_CARE_PROVIDER_SITE_OTHER): Payer: BLUE CROSS/BLUE SHIELD

## 2017-06-30 VITALS — BP 136/59 | HR 70 | Temp 97.5°F | Ht 67.5 in | Wt 183.5 lb

## 2017-06-30 DIAGNOSIS — R0600 Dyspnea, unspecified: Secondary | ICD-10-CM

## 2017-06-30 IMAGING — DX DG CHEST 2V
2 series · 2 of 2 positions shown · non-contrast
Comparison: None.

CLINICAL DATA: Shortness of breath

EXAM:
CHEST - 2 VIEW

[chest pa]
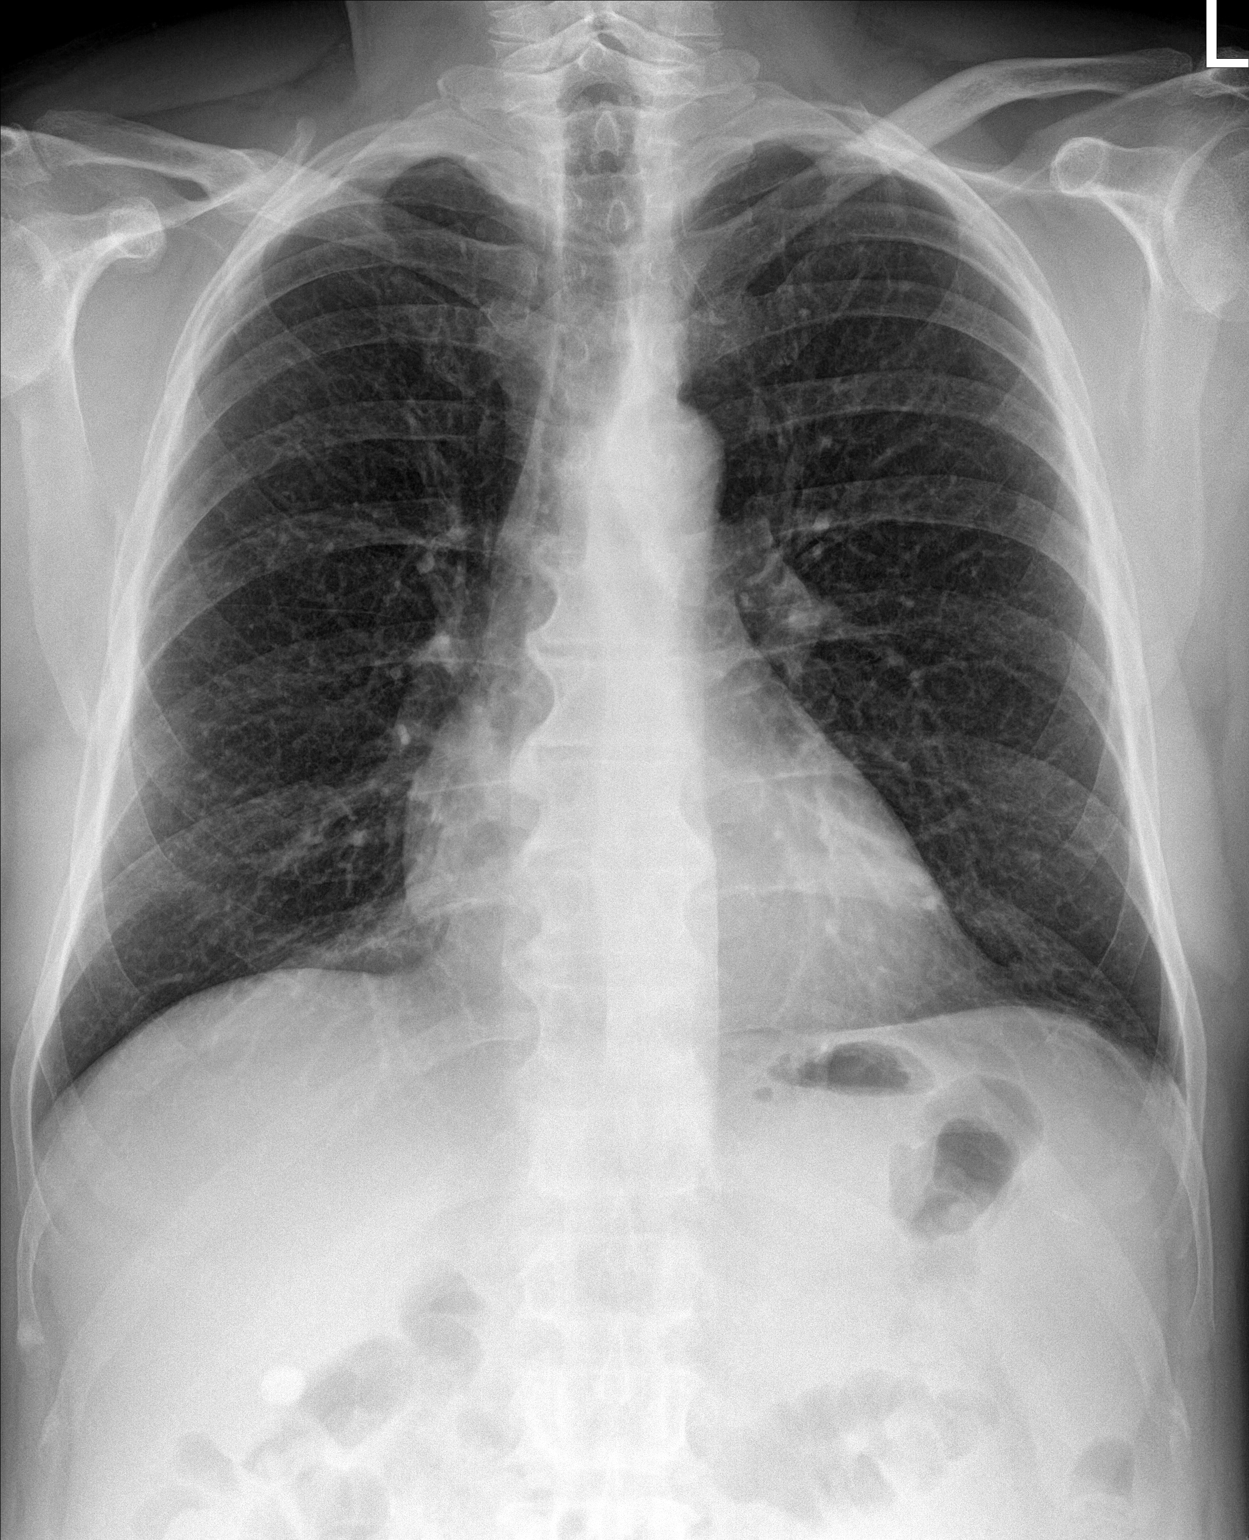

[chest lat]
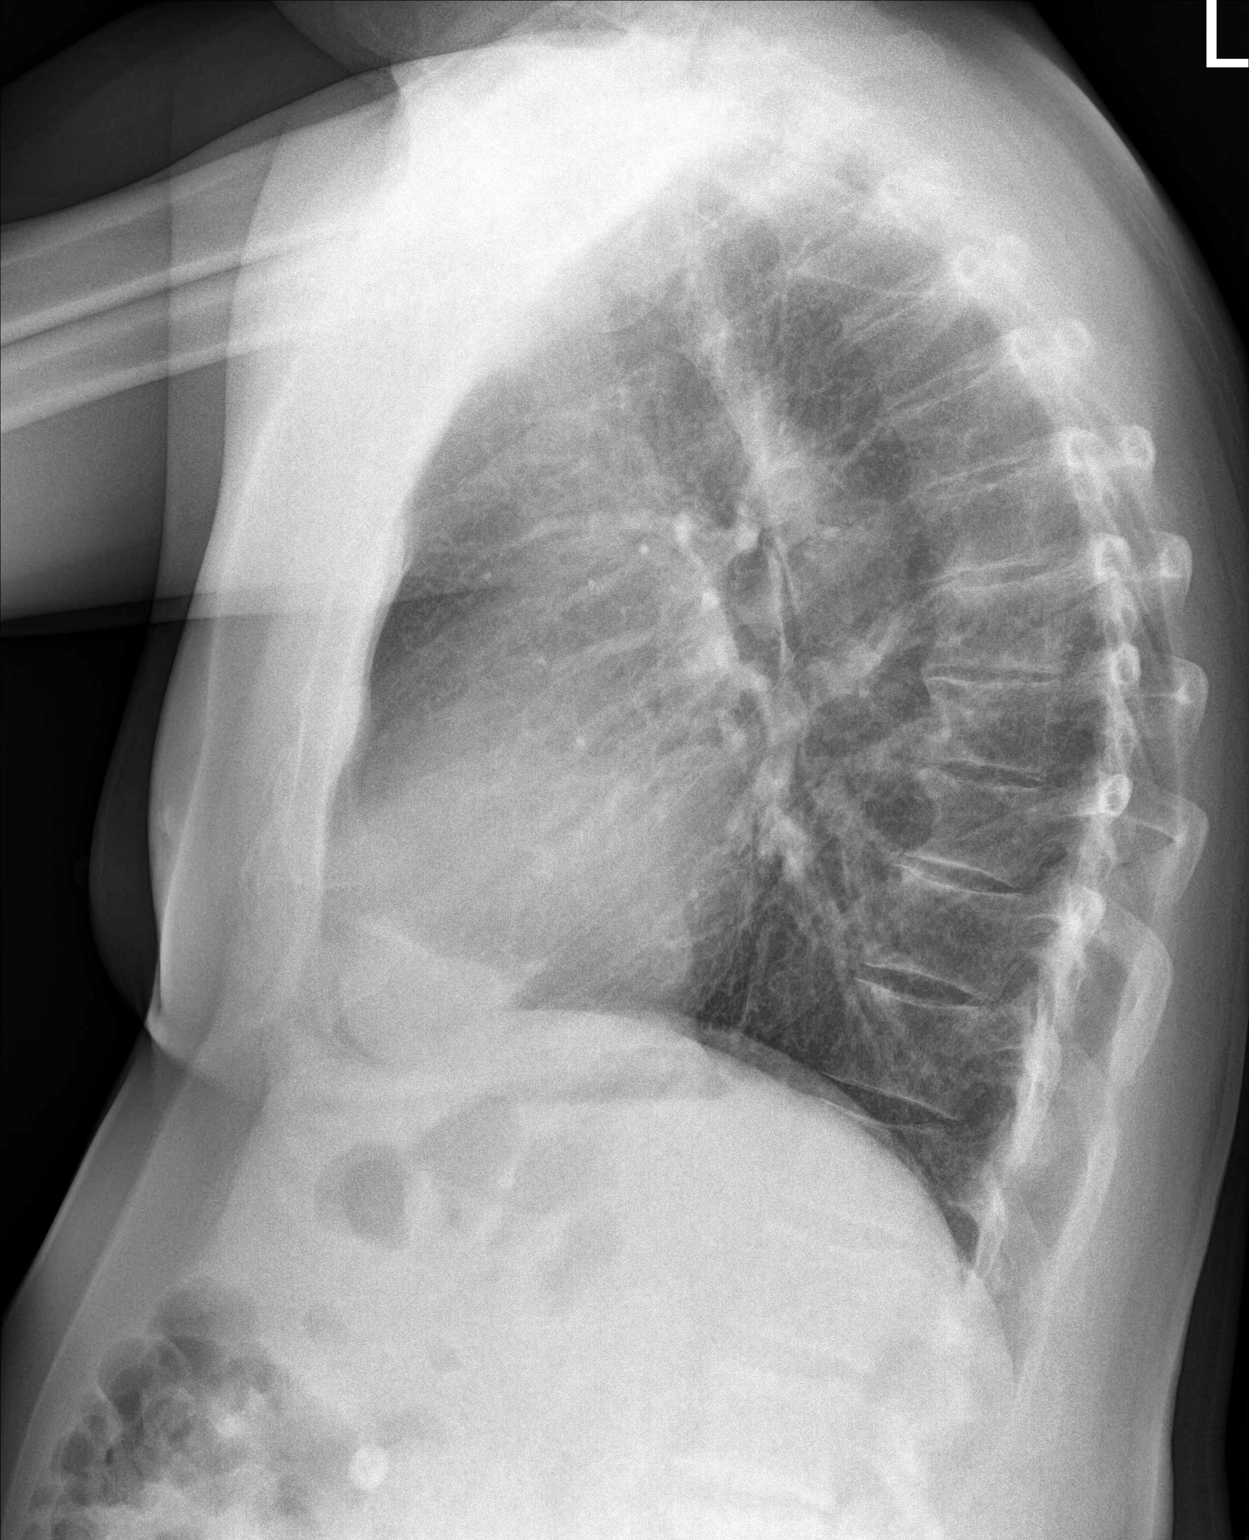

[2 of 2 positions shown; findings below may reference images not displayed]

FINDINGS: Heart and mediastinal contours are within normal limits. No focal
opacities or effusions. No acute bony abnormality.
IMPRESSION: No active cardiopulmonary disease.

## 2017-06-30 MED ORDER — ALBUTEROL SULFATE HFA 108 (90 BASE) MCG/ACT IN AERS: 2.0000 | INHALATION_SPRAY | Freq: Four times a day (QID) | RESPIRATORY_TRACT | 0 refills | Status: DC | PRN

## 2017-06-30 MED ORDER — FLUTICASONE PROPIONATE 50 MCG/ACT NA SUSP: 2.0000 | Freq: Every day | NASAL | 6 refills | Status: DC

## 2017-06-30 NOTE — Progress Notes (Signed)
  Subjective:     Patient ID: Michael Joseph, male   DOB: 22-Jun-1950, 67 y.o.   MRN: [id number]  HPI Pt with feeling that he cannot get a deep breathe when playing tennis States he has had these sx his whole life but now affecting his play Denies any assoc CP No recent illness + wheeze with sx No OTC meds for sx Currently taking meds for allergies Also with hx of sleep apnea and has been using CPAP Smoking hx + for pipe use  Review of Systems  Constitutional: Negative.   HENT: Positive for congestion and postnasal drip. Negative for ear discharge, ear pain, rhinorrhea, sinus pressure, sinus pain, sneezing and sore throat.   Eyes: Negative.   Respiratory: Positive for cough, chest tightness and wheezing.        Sx only when playing intense tennis  Cardiovascular: Negative.        Objective:   Physical Exam  Constitutional: He appears well-developed and well-nourished. No distress.  HENT:  Right Ear: External ear normal.  Left Ear: External ear normal.  Mouth/Throat: Oropharynx is clear and moist. No oropharyngeal exudate.  canals and TM's nl bilat  Neck: Neck supple.  Cardiovascular: Normal rate, regular rhythm and normal heart sounds.  Pulmonary/Chest: Effort normal. No stridor. No respiratory distress. He has wheezes.  Exp wheeze noted R>L  Lymphadenopathy:    He has no cervical adenopathy.  Skin: He is not diaphoretic.  Nursing note and vitals reviewed. CXR- see labs     Assessment:     1. Dyspnea, unspecified type        Plan:     This has been a chronic issue for pt and seems only to occur with intense exercise Will start on Alb Inhaler 30-45 min prior to matches Continue with other allergy meds Rf  of Flonase today F/U prn

## 2017-06-30 NOTE — Patient Instructions (Signed)
Exercise-Induced Bronchospasm  Exercise-induced bronchospasm (EIB) happens when the airways narrow during or after exercise. The airways are the passages that lead from the nose and mouth down into the lungs. When the airways narrow, this can cause coughing, wheezing, and shortness of breath. Anyone can develop this condition, even those who do not have asthma or allergies.  To help prevent episodes of EIB, you may need to take medicine or change your workout routine. You should tell your coach, teammates, or workout partners about your condition so they know how to help you if you do have an episode.  What are the causes?  The exact cause of this condition is not known. Symptoms are brought on (triggered) by physical activity. EIB can also be triggered by dry air or by allergens and irritants, such as the chemicals used in pools and skating rinks.  What increases the risk?  The following factors may make you more likely to develop this condition:   Having asthma.   Exercising in dry air.   Exercising outdoors during allergy season.   Playing an outdoor sport that requires continuous motion. This includes sports such as soccer, hockey, and cross-country skiing.    What are the signs or symptoms?  Symptoms of this condition include:   Wheezing.   Coughing.   Shortness of breath.   Tightness in the chest.   Sore throat.   Upset stomach.    How is this diagnosed?  This condition may be diagnosed based on your symptoms, your medical history, and a physical exam. A test may be done to measure how exercise affects your breathing (spirometry test). For this test, you breathe into a device before and after exercising.  How is this treated?  Treatment for this condition may include:   Changing your exercise routine. You may have to:  ? Spend a few minutes warming up before your workout.  ? Exercise indoors when the air is dry or during allergy season.   Taking medicine. Your health care provider may  prescribe:  ? An inhaler for you to use before you exercise.  ? Oral medicine to control allergies and asthma.  ? Inhaled steroids.    Follow these instructions at home:   Take over-the-counter and prescription medicines only as told by your health care provider.   Keep all bronchospasm medicine with you during your workout.   Make changes in your workout as told by your health care provider.   Wear a medical ID bracelet. Tell your coach, trainer, or teammates about your condition.   If you are planning to exercise alone or in an isolated area, let someone know where you are going and when you will be back.   Keep all follow-up visits as told by your health care provider. This is important.  How is this prevented?   Take medicines to prevent exercise-induced bronchospasm as told by your health care provider. Work with your coach or trainer to make changes to your workout as needed.   If dry air triggers exercise-induced bronchospasm:  ? Exercise indoors during peak allergy season and on days that are dry or cold.  ? Try to breathe in warm, moist air by wearing a scarf over your nose and mouth or breathing only through your nose.  Contact a health care provider if:   You have coughing, wheezing, or shortness of breath that continues after treatment.   Your coughing wakes you up at night.   You have less endurance than you   used to.  Get help right away if:   You cannot catch your breath.   You pass out.  This information is not intended to replace advice given to you by your health care provider. Make sure you discuss any questions you have with your health care provider.  Document Released: 01/11/2005 Document Revised: 01/30/2015 Document Reviewed: 09/18/2014  [employer]vier Interactive Patient Education  2017 [employer]

## 2017-11-02 ENCOUNTER — Other Ambulatory Visit: Payer: Self-pay | Admitting: Physician Assistant

## 2017-11-03 MED ORDER — ALBUTEROL SULFATE HFA 108 (90 BASE) MCG/ACT IN AERS: 2.0000 | INHALATION_SPRAY | Freq: Four times a day (QID) | RESPIRATORY_TRACT | 0 refills | Status: DC | PRN

## 2017-11-03 NOTE — Addendum Note (Signed)
Addended by: Antonietta Barcelona D on: 11/03/2017 08:13 AM   Modules accepted: Orders

## 2017-12-16 ENCOUNTER — Ambulatory Visit (INDEPENDENT_AMBULATORY_CARE_PROVIDER_SITE_OTHER): Payer: BLUE CROSS/BLUE SHIELD | Admitting: Family Medicine

## 2017-12-16 ENCOUNTER — Encounter: Payer: Self-pay | Admitting: Family Medicine

## 2017-12-16 VITALS — BP 118/69 | HR 72 | Temp 97.2°F | Ht 67.5 in | Wt 181.0 lb

## 2017-12-16 DIAGNOSIS — J069 Acute upper respiratory infection, unspecified: Secondary | ICD-10-CM

## 2017-12-16 DIAGNOSIS — J0100 Acute maxillary sinusitis, unspecified: Secondary | ICD-10-CM

## 2017-12-16 MED ORDER — DOXYCYCLINE HYCLATE 100 MG PO TABS: 100.0000 mg | ORAL_TABLET | Freq: Two times a day (BID) | ORAL | 0 refills | Status: AC

## 2017-12-16 NOTE — Patient Instructions (Signed)

## 2017-12-16 NOTE — Progress Notes (Signed)
   Subjective:    Patient ID: Michael Joseph, male    DOB: 11/28/1950, 67 y.o.   MRN: [id number]  Chief Complaint:  Cough (chest sore left side ); Nasal Congestion (no fever); and Shortness of Breath   HPI: Michael Joseph is a 67 y.o. male presenting on 12/16/2017 for Cough (chest sore left side ); Nasal Congestion (no fever); and Shortness of Breath  Pt presents today for complaints of cough, congestion, rhinorrhea, sore throat, and intermittent shortness of breath. States this started 5 weeks ago, he got better for a few days and then symptoms returned. Pt states he has coughed so much that his chest is sore. Denies palpitations, dizziness, diaphoresis, or weakness. States he does have fatigue and chills. Has not measured his temperature at home. His cough is productive. He has been using mucinex with minimal relief. States he has not been using his Flonase and is taking his Claritin. He reports being on Claritin for a long time.   Relevant past medical, surgical, family, and social history reviewed and updated as indicated.  Allergies and medications reviewed and updated.   Past Medical History:  Diagnosis Date  . Environmental allergies   . Obstructive sleep apnea   . Personal history of colonic polyps - adenoma 08/28/2013    Past Surgical History:  Procedure Laterality Date  . FRACTURE SURGERY     left wrist  . NASAL SEPTUM SURGERY  1987  . WRIST SURGERY Left     Social History   Socioeconomic History  . Marital status: Married    Spouse name: Not on file  . Number of children: Not on file  . Years of education: Not on file  . Highest education level: Not on file  Occupational History  . Not on file  Social Needs  . Financial resource strain: Not on file  . Food insecurity:    Worry: Not on file    Inability: Not on file  . Transportation needs:    Medical: Not on file    Non-medical: Not on file  Tobacco Use  . Smoking status: Former Smoker    Types: Pipe      Last attempt to quit: 01/26/2003    Years since quitting: 14.8  . Smokeless tobacco: Never Used  Substance and Sexual Activity  . Alcohol use: No    Comment: occ  . Drug use: No  . Sexual activity: Not on file  Lifestyle  . Physical activity:    Days per week: Not on file    Minutes per session: Not on file  . Stress: Not on file  Relationships  . Social connections:    Talks on phone: Not on file    Gets together: Not on file    Attends religious service: Not on file    Active member of club or organization: Not on file    Attends meetings of clubs or organizations: Not on file    Relationship status: Not on file  . Intimate partner violence:    Fear of current or ex partner: Not on file    Emotionally abused: Not on file    Physically abused: Not on file    Forced sexual activity: Not on file  Other Topics Concern  . Not on file  Social History Narrative  . Not on file    Outpatient Encounter Medications as of 12/16/2017  Medication Sig  . albuterol (PROAIR HFA) 108 (90 Base) MCG/ACT inhaler Inhale 2 puffs into   the lungs every 6 (six) hours as needed for wheezing or shortness of breath. 30-45 min prior to exercise Need to be seen  . fluticasone (FLONASE) 50 MCG/ACT nasal spray USE 2 SPRAYS IN EACH NOSTRIL DAILY  . loratadine-pseudoephedrine (CLARITIN-D 12-HOUR) 5-120 MG per tablet Take 1 tablet by mouth 2 (two) times daily.  . doxycycline (VIBRA-TABS) 100 MG tablet Take 1 tablet (100 mg total) by mouth 2 (two) times daily for 7 days. 1 po bid  . [DISCONTINUED] fluticasone (FLONASE) 50 MCG/ACT nasal spray Place 2 sprays into both nostrils daily.   No facility-administered encounter medications on file as of 12/16/2017.     Allergies  Allergen Reactions  . Amoxicillin     Skin rash    Review of Systems  Constitutional: Positive for chills and fatigue. Negative for fever.  HENT: Positive for congestion, postnasal drip, rhinorrhea, sinus pressure and sore throat.    Respiratory: Positive for cough and shortness of breath (intermittent ).   Gastrointestinal: Negative for abdominal pain, diarrhea, nausea and vomiting.  Musculoskeletal:       Chest wall tenderness  Skin: Negative for color change and rash.  Neurological: Positive for headaches. Negative for dizziness, seizures, syncope and weakness.  All other systems reviewed and are negative.       Objective:    BP 118/69 (BP Location: Left Arm)   Pulse 72   Temp (!) 97.2 F (36.2 C) (Oral)   Ht 5' 7.5" (1.715 m)   Wt 181 lb (82.1 kg)   SpO2 98%   BMI 27.93 kg/m    Wt Readings from Last 3 Encounters:  12/16/17 181 lb (82.1 kg)  06/30/17 183 lb 8 oz (83.2 kg)  01/23/16 189 lb 6 oz (85.9 kg)    Physical Exam  Constitutional: He is oriented to person, place, and time. He appears well-developed and well-nourished. He is cooperative. He does not appear ill.  HENT:  Head: Normocephalic and atraumatic.  Right Ear: Hearing, external ear and ear canal normal. Tympanic membrane is not perforated and not erythematous. A middle ear effusion is present.  Left Ear: Hearing, external ear and ear canal normal. Tympanic membrane is not perforated and not erythematous. A middle ear effusion is present.  Nose: Mucosal edema and rhinorrhea present.  Mouth/Throat: Uvula is midline and mucous membranes are normal. Posterior oropharyngeal erythema present. No oropharyngeal exudate, posterior oropharyngeal edema or tonsillar abscesses. No tonsillar exudate.  Eyes: Pupils are equal, round, and reactive to light. Conjunctivae and lids are normal.  Neck: Trachea normal and phonation normal.  Cardiovascular: Normal rate, regular rhythm, S1 normal, S2 normal and normal heart sounds.  Pulmonary/Chest: Effort normal. No respiratory distress. He has rhonchi (mild) in the right upper field and the left upper field. He exhibits tenderness (left upper chest wall).  Lymphadenopathy:    He has no cervical adenopathy.   Neurological: He is alert and oriented to person, place, and time.  Skin: Skin is warm. Capillary refill takes less than 2 seconds.  Psychiatric: He has a normal mood and affect. His speech is normal and behavior is normal. Judgment and thought content normal. Cognition and memory are normal.  Nursing note and vitals reviewed.   Results for orders placed or performed in visit on 01/23/16  CBC with Differential/Platelet  Result Value Ref Range   WBC 5.6 3.4 - 10.8 x10E3/uL   RBC 5.55 4.14 - 5.80 x10E6/uL   Hemoglobin 16.0 13.0 - 17.7 g/dL   Hematocrit 46.3 37.5 - 51.0 %     MCV 83 79 - 97 fL   MCH 28.8 26.6 - 33.0 pg   MCHC 34.6 31.5 - 35.7 g/dL   RDW 13.2 12.3 - 15.4 %   Platelets 283 150 - 379 x10E3/uL   Neutrophils 54 Not Estab. %   Lymphs 30 Not Estab. %   Monocytes 10 Not Estab. %   Eos 5 Not Estab. %   Basos 1 Not Estab. %   Neutrophils Absolute 3.1 1.4 - 7.0 x10E3/uL   Lymphocytes Absolute 1.7 0.7 - 3.1 x10E3/uL   Monocytes Absolute 0.6 0.1 - 0.9 x10E3/uL   EOS (ABSOLUTE) 0.3 0.0 - 0.4 x10E3/uL   Basophils Absolute 0.0 0.0 - 0.2 x10E3/uL   Immature Granulocytes 0 Not Estab. %   Immature Grans (Abs) 0.0 0.0 - 0.1 x10E3/uL  CMP14+EGFR  Result Value Ref Range   Glucose 106 (H) 65 - 99 mg/dL   BUN 13 8 - 27 mg/dL   Creatinine, Ser 0.72 (L) 0.76 - 1.27 mg/dL   GFR calc non Af Amer 99 >59 mL/min/1.73   GFR calc Af Amer 114 >59 mL/min/1.73   BUN/Creatinine Ratio 18 10 - 24   Sodium 141 134 - 144 mmol/L   Potassium 4.5 3.5 - 5.2 mmol/L   Chloride 102 96 - 106 mmol/L   CO2 20 18 - 29 mmol/L   Calcium 9.3 8.6 - 10.2 mg/dL   Total Protein 7.0 6.0 - 8.5 g/dL   Albumin 4.1 3.6 - 4.8 g/dL   Globulin, Total 2.9 1.5 - 4.5 g/dL   Albumin/Globulin Ratio 1.4 1.2 - 2.2   Bilirubin Total 0.3 0.0 - 1.2 mg/dL   Alkaline Phosphatase 83 39 - 117 IU/L   AST 24 0 - 40 IU/L   ALT 30 0 - 44 IU/L  Lipid panel  Result Value Ref Range   Cholesterol, Total 233 (H) 100 - 199 mg/dL    Triglycerides 524 (H) 0 - 149 mg/dL   HDL 35 (L) >39 mg/dL   VLDL Cholesterol Cal Comment 5 - 40 mg/dL   LDL Calculated Comment 0 - 99 mg/dL   Chol/HDL Ratio 6.7 (H) 0.0 - 5.0 ratio units  HIV antibody  Result Value Ref Range   HIV Screen 4th Generation wRfx Non Reactive Non Reactive  Hepatitis C antibody  Result Value Ref Range   Hep C Virus Ab <0.1 0.0 - 0.9 s/co ratio  PSA, total and free  Result Value Ref Range   Prostate Specific Ag, Serum 3.2 0.0 - 4.0 ng/mL   PSA, Free 0.54 N/A ng/mL   PSA, Free Pct 16.9 %  Hgb A1c w/o eAG  Result Value Ref Range   Hgb A1c MFr Bld 5.7 (H) 4.8 - 5.6 %  Specimen status report  Result Value Ref Range   specimen status report Comment        Pertinent labs & imaging results that were available during my care of the patient were reviewed by me and considered in my medical decision making.  Assessment & Plan:  Jazmin was seen today for cough, nasal congestion and shortness of breath.  Diagnoses and all orders for this visit:  Acute non-recurrent maxillary sinusitis Flonase daily for next 2 weeks, change Claritin to zyrtec. Increase fluid intake. Avoid cigarette smoke. Medications as prescribed.  -     doxycycline (VIBRA-TABS) 100 MG tablet; Take 1 tablet (100 mg total) by mouth 2 (two) times daily for 7 days. 1 po bid  Upper respiratory infection, acute - Get plenty  of rest and drink plenty of fluids. - Try to breathe moist air. Use a cold mist humidifier. - Consume warm fluids (soup or tea) to provide relief for a stuffy nose and to loosen phlegm. - For cough and congestion you can use plain Mucinex, regular or max strength, follow box directions.  - For nasal stuffiness, try saline nasal spray or a Neti Pot. You can use saline nasal spray 4 times daily. Do not use tap water in the Neti Pot, follow instructions on box for proper use. Afrin nasal spray can also be used but this product should not be used longer than 3 days or it will cause  rebound nasal stuffiness (worsening nasal congestion). - For sore throat pain relief: use chloraseptic spray, suck on throat lozenges, hard candy or popsicles; gargle with warm salt water (1/4 tsp. salt per 8 oz. of water); and eat soft, bland foods. - For fever or aches and pains take tylenol or motrin as appropriate for age and weight.  - Eat a well-balanced diet. If you cannot, ensure you are getting enough nutrients by taking a daily multivitamin. - Avoid dairy products, as they can thicken phlegm. - Avoid alcohol, as it impairs your body's immune system. - Change your toothbrush in 3 days.   CONTACT YOUR DOCTOR IF YOU EXPERIENCE ANY OF THE FOLLOWING: - High fever - Ear pain - Sinus-type headache - Unusually severe cold symptoms - Cough that gets worse while other cold symptoms improve - Flare up of any chronic lung problem, such as asthma - Your symptoms persist longer than 2 weeks       Continue all other maintenance medications.  Follow up plan: Return if symptoms worsen or fail to improve.  Educational handout given for sinusitis   The above assessment and management plan was discussed with the patient. The patient verbalized understanding of and has agreed to the management plan. Patient is aware to call the clinic if symptoms persist or worsen. Patient is aware when to return to the clinic for a follow-up visit. Patient educated on when it is appropriate to go to the emergency department.   Michelle Rakes, FNP-C [facility] [phone]  

## 2018-02-28 ENCOUNTER — Telehealth: Payer: Self-pay | Admitting: Family Medicine

## 2018-09-15 ENCOUNTER — Encounter: Payer: Self-pay | Admitting: Internal Medicine

## 2018-10-30 ENCOUNTER — Telehealth: Payer: Self-pay | Admitting: Family Medicine

## 2018-10-31 ENCOUNTER — Other Ambulatory Visit: Payer: Self-pay

## 2018-10-31 ENCOUNTER — Encounter: Payer: Self-pay | Admitting: Family Medicine

## 2018-10-31 ENCOUNTER — Ambulatory Visit (INDEPENDENT_AMBULATORY_CARE_PROVIDER_SITE_OTHER): Payer: Medicare HMO | Admitting: Family Medicine

## 2018-10-31 VITALS — BP 113/65 | HR 67 | Temp 98.6°F | Resp 20 | Ht 67.5 in | Wt 182.0 lb

## 2018-10-31 DIAGNOSIS — Z0000 Encounter for general adult medical examination without abnormal findings: Secondary | ICD-10-CM

## 2018-10-31 DIAGNOSIS — Z0001 Encounter for general adult medical examination with abnormal findings: Secondary | ICD-10-CM | POA: Diagnosis not present

## 2018-10-31 DIAGNOSIS — N401 Enlarged prostate with lower urinary tract symptoms: Secondary | ICD-10-CM

## 2018-10-31 DIAGNOSIS — Z125 Encounter for screening for malignant neoplasm of prostate: Secondary | ICD-10-CM | POA: Diagnosis not present

## 2018-10-31 DIAGNOSIS — E559 Vitamin D deficiency, unspecified: Secondary | ICD-10-CM

## 2018-10-31 DIAGNOSIS — R350 Frequency of micturition: Secondary | ICD-10-CM | POA: Diagnosis not present

## 2018-10-31 DIAGNOSIS — J3089 Other allergic rhinitis: Secondary | ICD-10-CM | POA: Diagnosis not present

## 2018-10-31 DIAGNOSIS — G4733 Obstructive sleep apnea (adult) (pediatric): Secondary | ICD-10-CM | POA: Diagnosis not present

## 2018-10-31 LAB — URINALYSIS
Bilirubin, UA: NEGATIVE
Glucose, UA: NEGATIVE
Ketones, UA: NEGATIVE
Leukocytes,UA: NEGATIVE
Nitrite, UA: NEGATIVE
Protein,UA: NEGATIVE
RBC, UA: NEGATIVE
Specific Gravity, UA: 1.01 (ref 1.005–1.030)
Urobilinogen, Ur: 0.2 mg/dL (ref 0.2–1.0)
pH, UA: 5.5 (ref 5.0–7.5)

## 2018-10-31 MED ORDER — TAMSULOSIN HCL 0.4 MG PO CAPS: 0.8000 mg | ORAL_CAPSULE | Freq: Every day | ORAL | 3 refills | Status: DC

## 2018-10-31 MED ORDER — FEXOFENADINE-PSEUDOEPHED ER 180-240 MG PO TB24: 1.0000 | ORAL_TABLET | Freq: Every evening | ORAL | 11 refills | Status: DC

## 2018-10-31 NOTE — Progress Notes (Signed)
Subjective:  Patient ID: Michael Joseph, male    DOB: Jun 04, 1950  Age: 68 y.o. MRN: [id number]  CC: Medical Management of Chronic Issues   HPI [name] presents for Complete physical.   Depression screen [facility] 2/9 10/31/2018 12/16/2017 06/30/2017  Decreased Interest 0 0 0  Down, Depressed, Hopeless 0 0 0  PHQ - 2 Score 0 0 0    History [name] has a past medical history of Environmental allergies, Obstructive sleep apnea, and Personal history of colonic polyps - adenoma (08/28/2013).   He has a past surgical history that includes Wrist surgery (Left); Nasal septum surgery (1987); and Fracture surgery.   His family history includes Diabetes in his mother.He reports that he quit smoking about 15 years ago. His smoking use included pipe. He has never used smokeless tobacco. He reports that he does not drink alcohol or use drugs.    ROS Review of Systems  Constitutional: Negative for activity change, fatigue and unexpected weight change.  HENT: Positive for congestion and postnasal drip. Negative for ear pain, hearing loss and trouble swallowing.   Eyes: Negative for pain and visual disturbance.  Respiratory: Negative for cough, chest tightness and shortness of breath.   Cardiovascular: Negative for chest pain, palpitations and leg swelling.  Gastrointestinal: Negative for abdominal distention, abdominal pain, blood in stool, constipation, diarrhea, nausea and vomiting.  Endocrine: Negative for cold intolerance, heat intolerance and polydipsia.  Genitourinary: Positive for frequency. Negative for difficulty urinating, dysuria, flank pain and urgency.  Musculoskeletal: Negative for arthralgias and joint swelling.  Skin: Negative for color change, rash and wound.  Neurological: Negative for dizziness, syncope, speech difficulty, weakness, light-headedness, numbness and headaches.  Hematological: Does not bruise/bleed easily.  Psychiatric/Behavioral: Negative for confusion,  decreased concentration, dysphoric mood and sleep disturbance. The patient is not nervous/anxious.     Objective:  BP 113/65   Pulse 67   Temp 98.6 F (37 C)   Resp 20   Ht 5' 7.5" (1.715 m)   Wt 182 lb (82.6 kg)   SpO2 96%   BMI 28.08 kg/m   BP Readings from Last 3 Encounters:  10/31/18 113/65  12/16/17 118/69  06/30/17 (!) 136/59    Wt Readings from Last 3 Encounters:  10/31/18 182 lb (82.6 kg)  12/16/17 181 lb (82.1 kg)  06/30/17 183 lb 8 oz (83.2 kg)     Physical Exam Constitutional:      Appearance: He is well-developed.  HENT:     Head: Normocephalic and atraumatic.  Eyes:     Pupils: Pupils are equal, round, and reactive to light.  Neck:     Musculoskeletal: Normal range of motion.     Thyroid: No thyromegaly.     Trachea: No tracheal deviation.  Cardiovascular:     Rate and Rhythm: Normal rate and regular rhythm.     Heart sounds: Normal heart sounds. No murmur. No friction rub. No gallop.   Pulmonary:     Breath sounds: Normal breath sounds. No wheezing or rales.  Abdominal:     General: Bowel sounds are normal. There is no distension.     Palpations: Abdomen is soft. There is no mass.     Tenderness: There is no abdominal tenderness.     Hernia: There is no hernia in the left inguinal area.  Genitourinary:    Penis: Normal.      Scrotum/Testes: Normal.  Musculoskeletal: Normal range of motion.  Lymphadenopathy:     Cervical: No cervical adenopathy.  Skin:    General: Skin is warm and dry.  Neurological:     Mental Status: He is alert and oriented to person, place, and time.       Assessment & Plan:   [name] was seen today for medical management of chronic issues.  Diagnoses and all orders for this visit:  Well adult exam -     CBC with Differential/Platelet -     CMP14+EGFR -     Lipid panel -     PSA Total (Reflex To Free) -     VITAMIN D 25 Hydroxy (Vit-D Deficiency, Fractures) -     Urinalysis  Obstructive sleep apnea -      CBC with Differential/Platelet -     CMP14+EGFR  Benign prostatic hyperplasia with urinary frequency -     CBC with Differential/Platelet -     CMP14+EGFR  Perennial allergic rhinitis -     CBC with Differential/Platelet -     CMP14+EGFR  Vitamin D deficiency -     VITAMIN D 25 Hydroxy (Vit-D Deficiency, Fractures)  Screening for prostate cancer -     PSA Total (Reflex To Free)  Other orders -     tamsulosin (FLOMAX) 0.4 MG CAPS capsule; Take 2 capsules (0.8 mg total) by mouth at bedtime. For urine flow and prostate -     fexofenadine-pseudoephedrine (ALLEGRA-D 24) 180-240 MG 24 hr tablet; Take 1 tablet by mouth every evening. For allergy and congestion -     %fPSA Reflex       I have discontinued Berry G. Corralejo's loratadine-pseudoephedrine. I am also having him start on tamsulosin and fexofenadine-pseudoephedrine. Additionally, I am having him maintain his fluticasone and albuterol.  Allergies as of 10/31/2018      Reactions   Amoxicillin    Skin rash      Medication List       Accurate as of October 31, 2018 11:59 PM. If you have any questions, ask your nurse or doctor.        STOP taking these medications   loratadine-pseudoephedrine 5-120 MG tablet Commonly known as: CLARITIN-D 12-hour Stopped by: Claretta Fraise, MD     TAKE these medications   albuterol 108 (90 Base) MCG/ACT inhaler Commonly known as: ProAir HFA Inhale 2 puffs into the lungs every 6 (six) hours as needed for wheezing or shortness of breath. 30-45 min prior to exercise Need to be seen   fexofenadine-pseudoephedrine 180-240 MG 24 hr tablet Commonly known as: ALLEGRA-D 24 Take 1 tablet by mouth every evening. For allergy and congestion Started by: Claretta Fraise, MD   fluticasone 50 MCG/ACT nasal spray Commonly known as: FLONASE USE 2 SPRAYS IN EACH NOSTRIL DAILY   tamsulosin 0.4 MG Caps capsule Commonly known as: FLOMAX Take 2 capsules (0.8 mg total) by mouth at bedtime. For urine  flow and prostate Started by: Claretta Fraise, MD        Follow-up: Return in about 6 months (around 05/01/2019).  Claretta Fraise, M.D.

## 2018-11-01 ENCOUNTER — Other Ambulatory Visit: Payer: Self-pay | Admitting: *Deleted

## 2018-11-01 ENCOUNTER — Other Ambulatory Visit: Payer: Self-pay | Admitting: Family Medicine

## 2018-11-01 DIAGNOSIS — R972 Elevated prostate specific antigen [PSA]: Secondary | ICD-10-CM

## 2018-11-01 LAB — LIPID PANEL
Chol/HDL Ratio: 4.9 ratio (ref 0.0–5.0)
Cholesterol, Total: 195 mg/dL (ref 100–199)
HDL: 40 mg/dL (ref >39)
LDL Chol Calc (NIH): 124 mg/dL — ABNORMAL HIGH (ref 0–99)
Triglycerides: 175 mg/dL — ABNORMAL HIGH (ref 0–149)
VLDL Cholesterol Cal: 31 mg/dL (ref 5–40)

## 2018-11-01 LAB — CMP14+EGFR
ALT: 24 IU/L (ref 0–44)
AST: 36 IU/L (ref 0–40)
Albumin/Globulin Ratio: 1.6 (ref 1.2–2.2)
Albumin: 4.2 g/dL (ref 3.8–4.8)
Alkaline Phosphatase: 79 IU/L (ref 39–117)
BUN/Creatinine Ratio: 17 (ref 10–24)
BUN: 15 mg/dL (ref 8–27)
Bilirubin Total: 0.6 mg/dL (ref 0.0–1.2)
CO2: 23 mmol/L (ref 20–29)
Calcium: 9.5 mg/dL (ref 8.6–10.2)
Chloride: 104 mmol/L (ref 96–106)
Creatinine, Ser: 0.88 mg/dL (ref 0.76–1.27)
GFR calc Af Amer: 103 mL/min/{1.73_m2} (ref >59)
GFR calc non Af Amer: 89 mL/min/{1.73_m2} (ref >59)
Globulin, Total: 2.6 g/dL (ref 1.5–4.5)
Glucose: 99 mg/dL (ref 65–99)
Potassium: 4.2 mmol/L (ref 3.5–5.2)
Sodium: 139 mmol/L (ref 134–144)
Total Protein: 6.8 g/dL (ref 6.0–8.5)

## 2018-11-01 LAB — CBC WITH DIFFERENTIAL/PLATELET
Basophils Absolute: 0.1 10*3/uL (ref 0.0–0.2)
Basos: 1 %
EOS (ABSOLUTE): 0.4 10*3/uL (ref 0.0–0.4)
Eos: 7 %
Hematocrit: 45.5 % (ref 37.5–51.0)
Hemoglobin: 15.4 g/dL (ref 13.0–17.7)
Immature Grans (Abs): 0 10*3/uL (ref 0.0–0.1)
Immature Granulocytes: 0 %
Lymphocytes Absolute: 2.1 10*3/uL (ref 0.7–3.1)
Lymphs: 36 %
MCH: 27.7 pg (ref 26.6–33.0)
MCHC: 33.8 g/dL (ref 31.5–35.7)
MCV: 82 fL (ref 79–97)
Monocytes Absolute: 0.4 10*3/uL (ref 0.1–0.9)
Monocytes: 7 %
Neutrophils Absolute: 2.8 10*3/uL (ref 1.4–7.0)
Neutrophils: 49 %
Platelets: 301 10*3/uL (ref 150–450)
RBC: 5.55 x10E6/uL (ref 4.14–5.80)
RDW: 12.7 % (ref 11.6–15.4)
WBC: 5.7 10*3/uL (ref 3.4–10.8)

## 2018-11-01 LAB — PSA TOTAL (REFLEX TO FREE): Prostate Specific Ag, Serum: 4.8 ng/mL — ABNORMAL HIGH (ref 0.0–4.0)

## 2018-11-01 LAB — FPSA% REFLEX
% FREE PSA: 17.1 %
PSA, FREE: 0.82 ng/mL

## 2018-11-01 LAB — VITAMIN D 25 HYDROXY (VIT D DEFICIENCY, FRACTURES): Vit D, 25-Hydroxy: 21.5 ng/mL — ABNORMAL LOW (ref 30.0–100.0)

## 2018-11-01 MED ORDER — VITAMIN D (ERGOCALCIFEROL) 1.25 MG (50000 UNIT) PO CAPS: 50000.0000 [IU] | ORAL_CAPSULE | ORAL | 3 refills | Status: AC

## 2018-12-11 DIAGNOSIS — R69 Illness, unspecified: Secondary | ICD-10-CM | POA: Diagnosis not present

## 2019-05-03 ENCOUNTER — Ambulatory Visit: Payer: Medicare HMO | Admitting: Family Medicine

## 2019-06-29 ENCOUNTER — Other Ambulatory Visit: Payer: Self-pay | Admitting: *Deleted

## 2019-06-29 MED ORDER — FLUTICASONE PROPIONATE 50 MCG/ACT NA SUSP: 2.0000 | Freq: Every day | NASAL | 1 refills | Status: DC

## 2019-09-03 DIAGNOSIS — R69 Illness, unspecified: Secondary | ICD-10-CM | POA: Diagnosis not present

## 2019-10-08 DIAGNOSIS — R69 Illness, unspecified: Secondary | ICD-10-CM | POA: Diagnosis not present

## 2019-10-10 ENCOUNTER — Other Ambulatory Visit: Payer: Self-pay

## 2019-10-10 ENCOUNTER — Ambulatory Visit (INDEPENDENT_AMBULATORY_CARE_PROVIDER_SITE_OTHER): Payer: Medicare HMO | Admitting: Family Medicine

## 2019-10-10 ENCOUNTER — Encounter: Payer: Self-pay | Admitting: Family Medicine

## 2019-10-10 VITALS — BP 120/68 | HR 68 | Temp 97.7°F | Resp 20 | Ht 67.5 in | Wt 179.0 lb

## 2019-10-10 DIAGNOSIS — Z23 Encounter for immunization: Secondary | ICD-10-CM

## 2019-10-10 DIAGNOSIS — Z0000 Encounter for general adult medical examination without abnormal findings: Secondary | ICD-10-CM | POA: Diagnosis not present

## 2019-10-10 MED ORDER — FINASTERIDE 5 MG PO TABS: 5.0000 mg | ORAL_TABLET | Freq: Every day | ORAL | 1 refills | Status: DC

## 2019-10-10 MED ORDER — TAMSULOSIN HCL 0.4 MG PO CAPS: 0.8000 mg | ORAL_CAPSULE | Freq: Every day | ORAL | 3 refills | Status: DC

## 2019-10-10 MED ORDER — FEXOFENADINE-PSEUDOEPHED ER 180-240 MG PO TB24: 1.0000 | ORAL_TABLET | Freq: Every evening | ORAL | 11 refills | Status: DC

## 2019-10-10 MED ORDER — ALBUTEROL SULFATE HFA 108 (90 BASE) MCG/ACT IN AERS: 2.0000 | INHALATION_SPRAY | Freq: Four times a day (QID) | RESPIRATORY_TRACT | 0 refills | Status: DC | PRN

## 2019-10-10 MED ORDER — FLUTICASONE PROPIONATE 50 MCG/ACT NA SUSP: 2.0000 | Freq: Every day | NASAL | 1 refills | Status: DC

## 2019-10-10 NOTE — Progress Notes (Signed)
WELCOME TO MEDICARE (IPPE) VISIT  10/10/2019  [facility] DOB:May 05, 1950     QMV:[id number]  I explained that today's visit was for the purpose of health promotion and disease detection, as well as an introduction to Medicare and it's covered benefits.  I explained that no labs or other services would be performed today. If labs or other services are determined to be necessary the appropriate orders/referrals will be arranged for these to be done at a future date.  [place] [facility] is a 69 y.o. year old male primary care patient of [place] [facility].He  works as a [place]. He and his wife live in [place].   He issing CPAP for apnea. He feels much better when he uses it, which is always. He has  Fall and Spring allergies. [place] uses albuterol for playing Tennis in the Spring and fall. He golfs occasionally as well.   Pt. Due for colonoscopy. [place].   MEDICAL AND SOCIAL HISTORY Past Medical History Past Medical History:  Diagnosis Date  . Environmental allergies   . Obstructive sleep apnea   . Personal history of colonic polyps - adenoma 08/28/2013    Surgical History Past Surgical History:  Procedure Laterality Date  . FRACTURE SURGERY     left wrist  . NASAL SEPTUM SURGERY  1987  . WRIST SURGERY Left   Let collar bone fracture. Remote  Family History Family History  Problem Relation Age of Onset  . Diabetes Mother   . Colon cancer Neg Hx   . Pancreatic cancer Neg Hx   . Rectal cancer Neg Hx   . Stomach cancer Neg Hx     Social History Social History   Socioeconomic History  . Marital status: Married    Spouse name: Not on file  . Number of children: Not on file  . Years of education: Not on file  . Highest education level: Not on file  Occupational History  . Not on file  Tobacco Use  . Smoking status: Former Smoker    Types: Pipe    Quit date: 01/26/2003    Years since quitting: 16.7  . Smokeless tobacco: Never Used  Substance and  Sexual Activity  . Alcohol use: No    Comment: occ  . Drug use: No  . Sexual activity: Not on file  Other Topics Concern  . Not on file  Social History Narrative  . Not on file   Social Determinants of Health   Financial Resource Strain:   . Difficulty of Paying Living Expenses: Not on file  Food Insecurity:   . Worried About Charity fundraiser in the Last Year: Not on file  . Ran Out of Food in the Last Year: Not on file  Transportation Needs:   . Lack of Transportation (Medical): Not on file  . Lack of Transportation (Non-Medical): Not on file  Physical Activity:   . Days of Exercise per Week: Not on file  . Minutes of Exercise per Session: Not on file  Stress:   . Feeling of Stress : Not on file  Social Connections:   . Frequency of Communication with Friends and Family: Not on file  . Frequency of Social Gatherings with Friends and Family: Not on file  . Attends Religious Services: Not on file  . Active Member of Clubs or Organizations: Not on file  . Attends [place] Meetings: Not on file  . Marital Status: Not on file    Current  Medications & Allergies Outpatient Encounter Medications as of 10/10/2019  Medication Sig  . albuterol (PROAIR HFA) 108 (90 Base) MCG/ACT inhaler Inhale 2 puffs into the lungs every 6 (six) hours as needed for wheezing or shortness of breath. 30-45 min prior to exercise Need to be seen  . fexofenadine-pseudoephedrine (ALLEGRA-D 24) 180-240 MG 24 hr tablet Take 1 tablet by mouth every evening. For allergy and congestion  . fluticasone (FLONASE) 50 MCG/ACT nasal spray Place 2 sprays into both nostrils daily.  . tamsulosin (FLOMAX) 0.4 MG CAPS capsule Take 2 capsules (0.8 mg total) by mouth at bedtime. For urine flow and prostate  . Vitamin D, Ergocalciferol, (DRISDOL) 1.25 MG (50000 UT) CAPS capsule Take 1 capsule (50,000 Units total) by mouth every 7 (seven) days.  . [DISCONTINUED] tamsulosin (FLOMAX) 0.4 MG CAPS capsule Take 2  capsules (0.8 mg total) by mouth at bedtime. For urine flow and prostate  . finasteride (PROSCAR) 5 MG tablet Take 1 tablet (5 mg total) by mouth daily.   No facility-administered encounter medications on file as of 10/10/2019.   Amoxicillin  Diet & Physical Activity     Pt consumes 2 meals a day and 1 snacks a day.  The patient feels that he mostly follow a Regular diet.   DEPRESSION SCREENING PHQ 2/9 Scores 10/10/2019 10/31/2018 12/16/2017 06/30/2017 01/23/2016 04/01/2014  PHQ - 2 Score 0 0 0 0 0 0     FUNCTIONAL ABILITY & LEVEL OF SAFETY Fall Risk Fall Risk  10/10/2019 10/31/2018 12/16/2017 06/30/2017 01/23/2016  Falls in the past year? 0 0 0 No No  Follow up Falls evaluation completed - - - -    Activities of Daily Living No flowsheet data found.  Cognitive Function MMSE - Mini Mental State Exam 10/10/2019  Orientation to time 5  Orientation to Place 5  Registration 3  Attention/ Calculation 5  Recall 3  Language- name 2 objects 2  Language- repeat 1  Language- follow 3 step command 3  Language- read & follow direction 1  Write a sentence 1  Copy design 1  Total score 30       Normal Cognitive Function Screening today: Yes   EXAM (physical exam is not indicated for this visit type) Today's Vitals   10/10/19 1432  BP: 120/68  Pulse: 68  Resp: 20  Temp: 97.7 F (36.5 C)  TempSrc: Temporal  SpO2: 96%  Weight: 179 lb (81.2 kg)  Height: 5' 7.5" (1.715 m)   Body mass index is 27.62 kg/m.  Visual Acuity Screen: No exam data present Pt gets routine eye care through optometrist/ophthalmologist and is not up to date with follow up care with this provider No additional physical exam required or indicated today.  Health Maintenance Health Maintenance  Topic Date Due  . COVID-19 Vaccine (1) Never done  . TETANUS/TDAP  Never done  . COLONOSCOPY  08/29/2018  . PNA vac Low Risk Adult (1 of 2 - PCV13) 10/31/2019 (Originally 08/09/2016)  . Hepatitis C Screening   Completed  . INFLUENZA VACCINE  Discontinued     END OF LIFE PLANNING Advanced directives and power of attorney information specific to the patient were discussed.   Advanced Directives 08/13/2013  Does Patient Have a Medical Advance Directive? Patient does not have advance directive  Pre-existing out of facility DNR order (yellow form or pink MOST form) No     Education, counseling, and referrals based on the information obtained/reviewed today: Colonoscopy, vision exam  Education, counseling, and referral  for other preventive services: Written checklist was completed and given to pt for obtaining, as appropriate, the other preventive services that are covered as separate Medicare Part B benefits. Possible services that were reviewed with pt are:  -annual wellness visit (AWV) -Bone mass measurements -Cardiovascular screening blood tests -Colorectal cancer screening -Counseling to prevent tobacco use -Diabetes screening tests -Diabetes self-management training (DSMT) -Glaucoma screening -HIV screening -Medical nutrition therapy -Prostate cancer screening -Seasonal influenza, pneumococcal, and Hep B vaccines -screening mammography -screening pap tests and pelvic exam -ultrasound screening for AAA  Of the above listed services,  AAA screen was arranged.  Patient was given opportunity to ask any additional questions regarding Medicare and covered benefits.  Patient was informed that Medicare does not provide coverage for routine physical exams.  I answered all questions to the best of my ability today.    Follow up: annually   1. Welcome to Medicare preventive visit     Meds ordered this encounter  Medications  . tamsulosin (FLOMAX) 0.4 MG CAPS capsule    Sig: Take 2 capsules (0.8 mg total) by mouth at bedtime. For urine flow and prostate    Dispense:  180 capsule    Refill:  3  . finasteride (PROSCAR) 5 MG tablet    Sig: Take 1 tablet (5 mg total) by mouth daily.     Dispense:  90 tablet    Refill:  1  . albuterol (PROAIR HFA) 108 (90 Base) MCG/ACT inhaler    Sig: Inhale 2 puffs into the lungs every 6 (six) hours as needed for wheezing or shortness of breath. 30-45 min prior to exercise Need to be seen    Dispense:  8.5 g    Refill:  0    $  . fexofenadine-pseudoephedrine (ALLEGRA-D 24) 180-240 MG 24 hr tablet    Sig: Take 1 tablet by mouth every evening. For allergy and congestion    Dispense:  30 tablet    Refill:  11  . fluticasone (FLONASE) 50 MCG/ACT nasal spray    Sig: Place 2 sprays into both nostrils daily.    Dispense:  48 g    Refill:  1    Orders Placed This Encounter  Procedures  . US AORTA MEDICARE SCREENING    Standing Status:   Future    Standing Expiration Date:   01/09/2020    Order Specific Question:   Reason for Exam (SYMPTOM  OR DIAGNOSIS REQUIRED)    Answer:   screening for AAA    Order Specific Question:   Preferred imaging location?    Answer:   [facility]  . Tdap vaccine greater than or equal to 7yo IM  . EKG 12-Lead      Michael Fraise, MD

## 2019-10-11 ENCOUNTER — Encounter: Payer: Self-pay | Admitting: Family Medicine

## 2019-10-15 ENCOUNTER — Telehealth: Payer: Self-pay | Admitting: Family Medicine

## 2019-10-19 ENCOUNTER — Ambulatory Visit (HOSPITAL_COMMUNITY): Admission: RE | Admit: 2019-10-19 | Payer: Medicare HMO | Source: Ambulatory Visit

## 2019-10-25 ENCOUNTER — Other Ambulatory Visit: Payer: Self-pay

## 2019-10-25 ENCOUNTER — Ambulatory Visit (HOSPITAL_COMMUNITY)
Admission: RE | Admit: 2019-10-25 | Discharge: 2019-10-25 | Disposition: A | Discharge status: Home / Self Care | Payer: Medicare HMO | Source: Ambulatory Visit | Attending: Family Medicine | Admitting: Family Medicine

## 2019-10-25 DIAGNOSIS — Z0000 Encounter for general adult medical examination without abnormal findings: Secondary | ICD-10-CM | POA: Diagnosis not present

## 2019-10-25 DIAGNOSIS — Z136 Encounter for screening for cardiovascular disorders: Secondary | ICD-10-CM | POA: Diagnosis not present

## 2019-10-25 DIAGNOSIS — Z87891 Personal history of nicotine dependence: Secondary | ICD-10-CM | POA: Diagnosis not present

## 2019-10-25 IMAGING — US US ABDOMINAL AORTA SCREENING AAA
1 series · 14 of 16 positions shown · non-contrast
Comparison: None.

CLINICAL DATA: Male between 65-75 years of age with a smoking
history.

EXAM:
US ABDOMINAL AORTA MEDICARE SCREENING
TECHNIQUE: Ultrasound examination of the abdominal aorta was performed as a
screening evaluation for abdominal aortic aneurysm.

[Series 1: us aorta medicare screening · 14 of 16 slices shown]
[im 1/16]
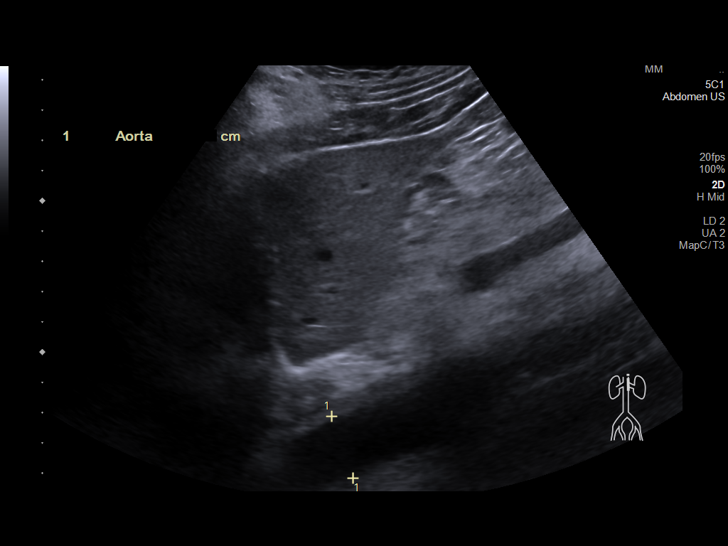
[im 2/16]
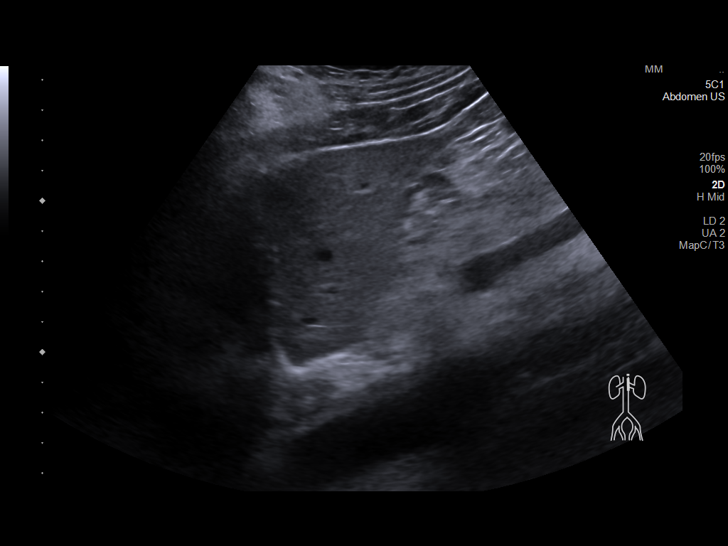
[im 3/16]
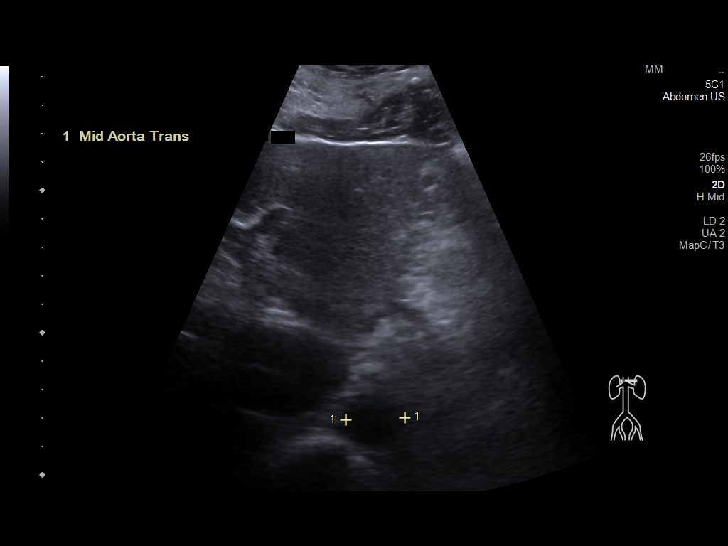
[im 5/16]
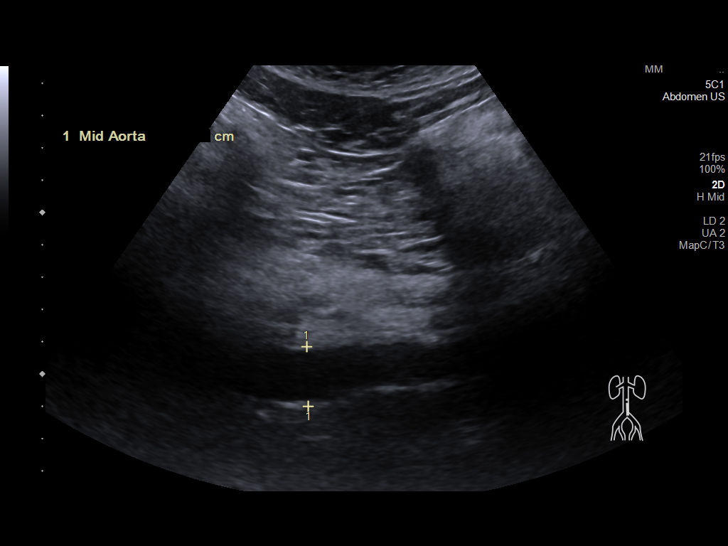
[im 6/16]
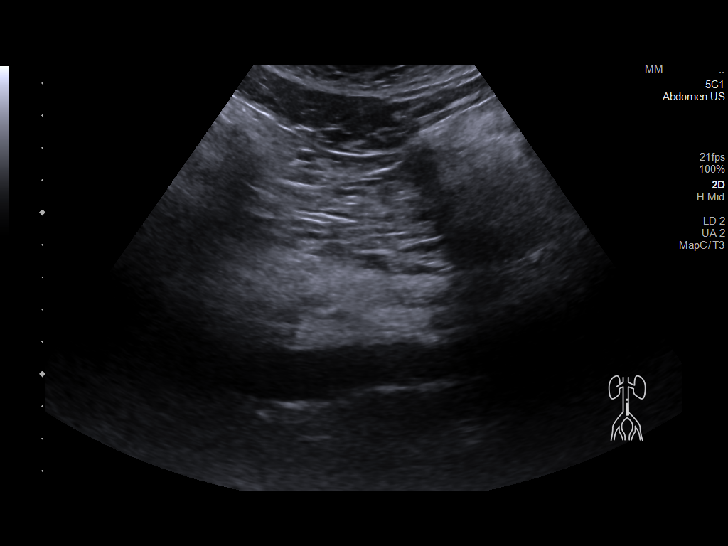
[im 7/16]
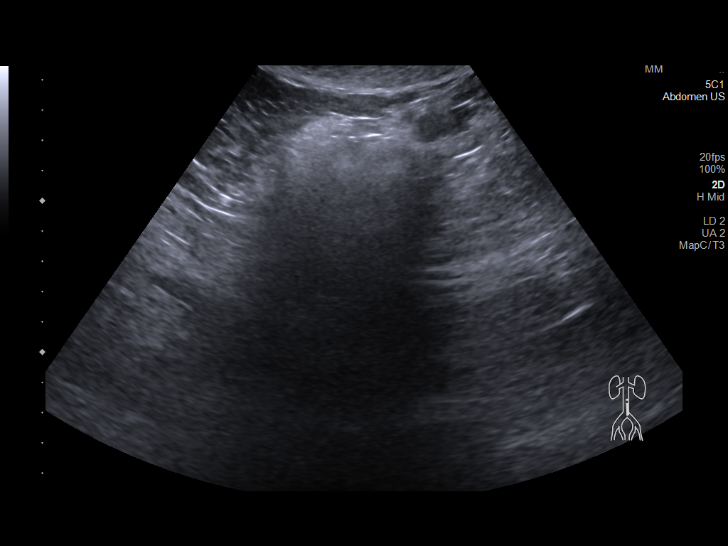
[im 8/16]
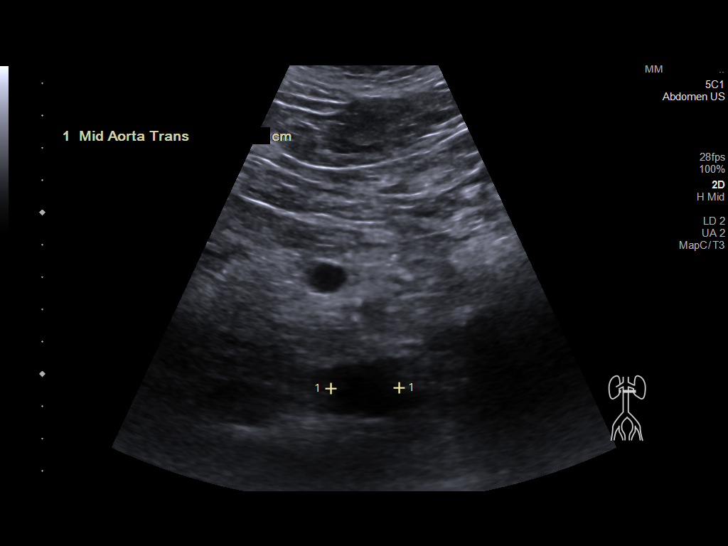
[im 9/16]
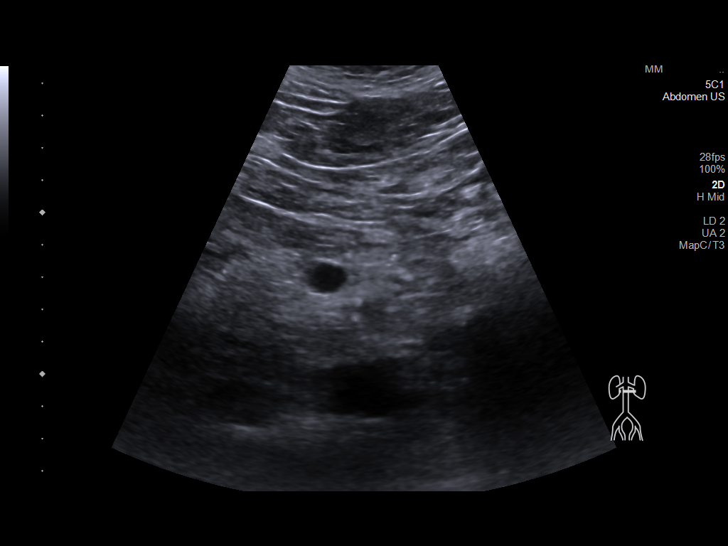
[im 10/16]
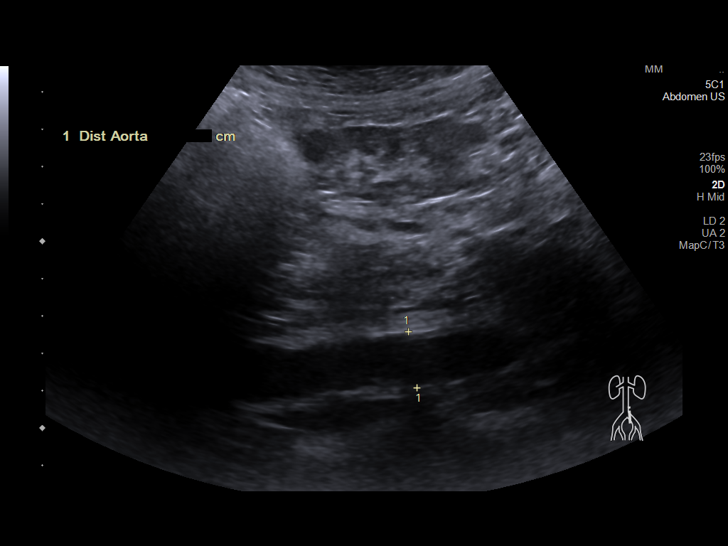
[im 11/16]
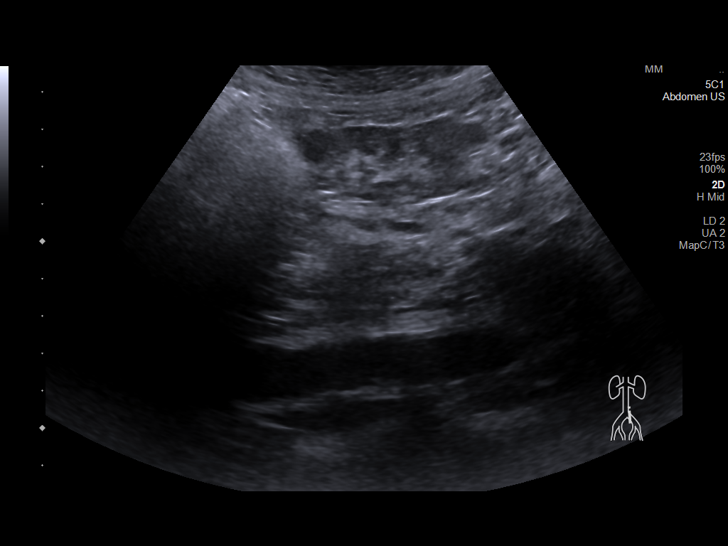
[im 13/16]
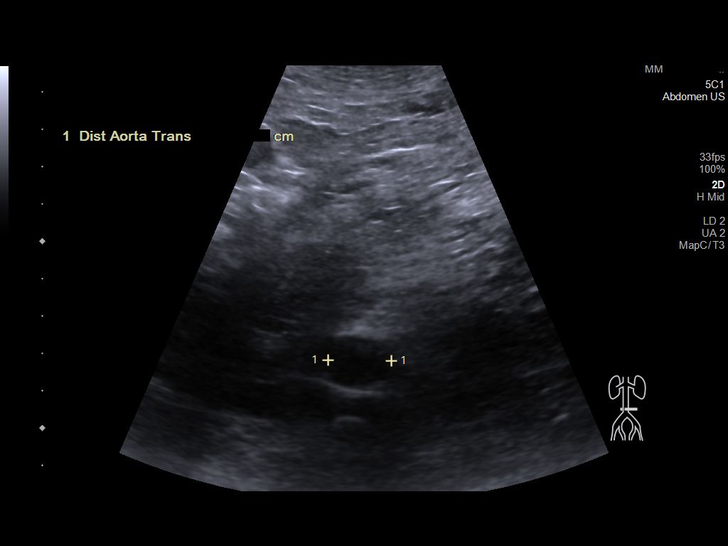
[im 14/16]
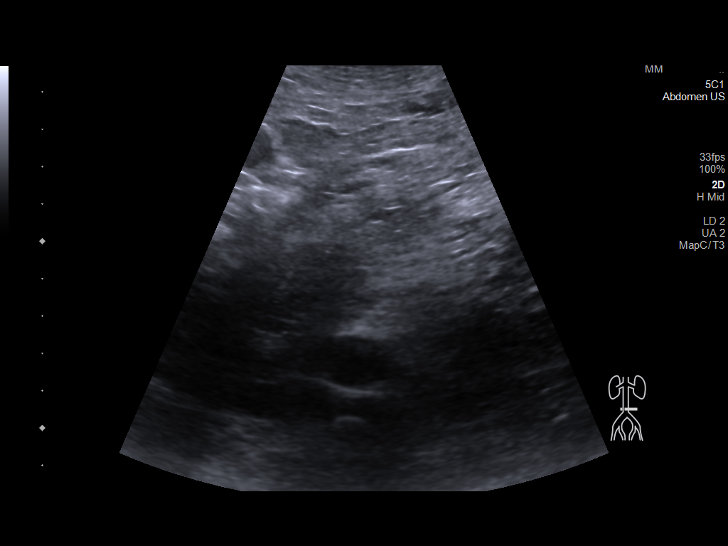
[im 15/16]
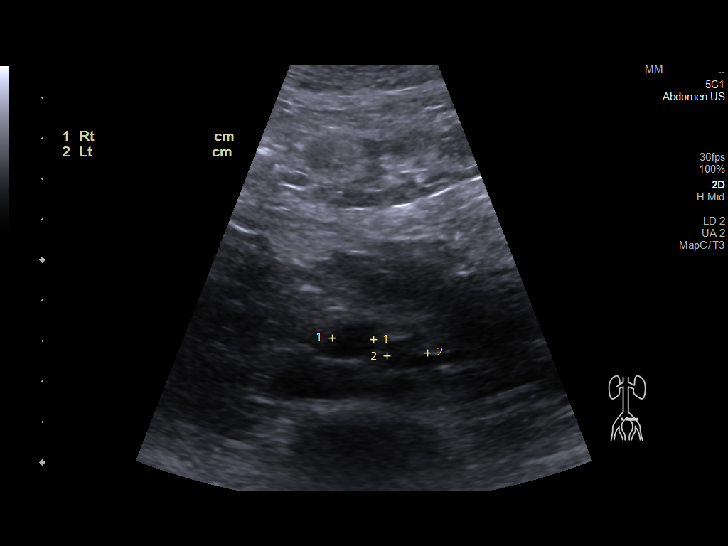
[im 16/16]
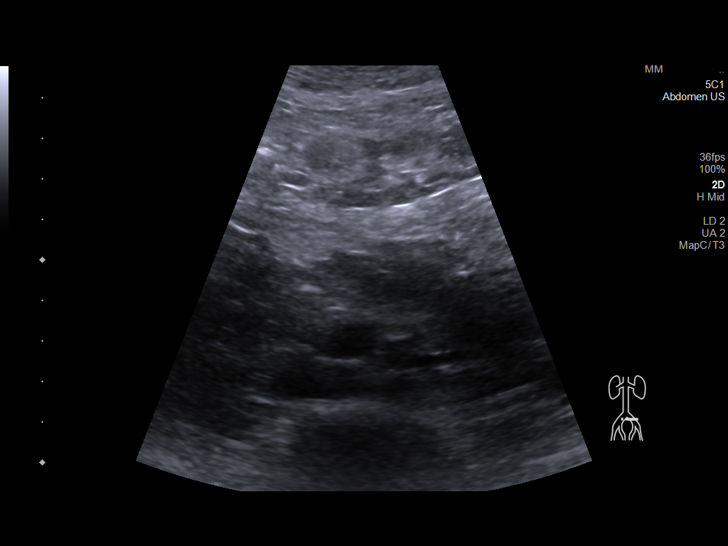

[14 of 16 positions shown; findings below may reference images not displayed]

FINDINGS: Abdominal aortic measurements as follows:

Proximal:  2.2 x 2.1 cm

Mid:  1.9 x 2.1 cm

Distal:  1.5 x 1.7 cm
IMPRESSION: Negative for abdominal aortic aneurysm.

## 2019-10-30 ENCOUNTER — Telehealth: Payer: Self-pay

## 2019-11-03 DIAGNOSIS — H5203 Hypermetropia, bilateral: Secondary | ICD-10-CM | POA: Diagnosis not present

## 2019-11-03 DIAGNOSIS — H5213 Myopia, bilateral: Secondary | ICD-10-CM | POA: Diagnosis not present

## 2019-11-03 DIAGNOSIS — H524 Presbyopia: Secondary | ICD-10-CM | POA: Diagnosis not present

## 2019-11-03 DIAGNOSIS — H52229 Regular astigmatism, unspecified eye: Secondary | ICD-10-CM | POA: Diagnosis not present

## 2019-11-14 DIAGNOSIS — R69 Illness, unspecified: Secondary | ICD-10-CM | POA: Diagnosis not present

## 2020-01-28 DIAGNOSIS — Z20822 Contact with and (suspected) exposure to covid-19: Secondary | ICD-10-CM | POA: Diagnosis not present

## 2021-01-28 ENCOUNTER — Other Ambulatory Visit: Payer: Self-pay | Admitting: Family Medicine

## 2021-02-04 ENCOUNTER — Encounter: Payer: Self-pay | Admitting: Family Medicine

## 2021-02-04 ENCOUNTER — Ambulatory Visit (INDEPENDENT_AMBULATORY_CARE_PROVIDER_SITE_OTHER): Payer: Medicare HMO | Admitting: Family Medicine

## 2021-02-04 DIAGNOSIS — J40 Bronchitis, not specified as acute or chronic: Secondary | ICD-10-CM | POA: Diagnosis not present

## 2021-02-04 DIAGNOSIS — J329 Chronic sinusitis, unspecified: Secondary | ICD-10-CM | POA: Diagnosis not present

## 2021-02-04 MED ORDER — FEXOFENADINE-PSEUDOEPHED ER 180-240 MG PO TB24: 1.0000 | ORAL_TABLET | Freq: Every evening | ORAL | 11 refills | Status: AC

## 2021-02-04 MED ORDER — AZITHROMYCIN 250 MG PO TABS: ORAL_TABLET | ORAL | 0 refills | Status: DC

## 2021-02-04 NOTE — Progress Notes (Signed)
° ° °  Subjective:    Patient ID: [place] Joseph, male    DOB: July 03, 1950, 71 y.o.   MRN: [id number]   HPI: [place] is a 71 y.o. male presenting for 2-3 weeks ago had a sore throat. Coughing since. No fever. Chest congested. Sputum was thick. Now watery. HAving wheezing with exertion. Decreased exercise tolerance.    Depression screen [facility] 2/9 10/10/2019 10/31/2018 12/16/2017 06/30/2017 01/23/2016  Decreased Interest 0 0 0 0 0  Down, Depressed, Hopeless 0 0 0 0 0  PHQ - 2 Score 0 0 0 0 0     Relevant past medical, surgical, family and social history reviewed and updated as indicated.  Interim medical history since our last visit reviewed. Allergies and medications reviewed and updated.  ROS:  Review of Systems   Social History   Tobacco Use  Smoking Status Former   Types: Pipe   Quit date: 01/26/2003   Years since quitting: 18.0  Smokeless Tobacco Never       Objective:     Wt Readings from Last 3 Encounters:  10/10/19 179 lb (81.2 kg)  10/31/18 182 lb (82.6 kg)  12/16/17 181 lb (82.1 kg)     Exam deferred. Pt. Harboring due to COVID 19. Phone visit performed.   Assessment & Plan:   1. Sinobronchitis     Meds ordered this encounter  Medications   azithromycin (ZITHROMAX Z-PAK) 250 MG tablet    Sig: Take two right away Then one a day for the next 4 days.    Dispense:  6 each    Refill:  0   fexofenadine-pseudoephedrine (ALLEGRA-D 24) 180-240 MG 24 hr tablet    Sig: Take 1 tablet by mouth every evening. For allergy and congestion    Dispense:  30 tablet    Refill:  11    No orders of the defined types were placed in this encounter.     Diagnoses and all orders for this visit:  Sinobronchitis  Other orders -     azithromycin (ZITHROMAX Z-PAK) 250 MG tablet; Take two right away Then one a day for the next 4 days. -     fexofenadine-pseudoephedrine (ALLEGRA-D 24) 180-240 MG 24 hr tablet; Take 1 tablet by mouth every evening. For allergy and  congestion    Virtual Visit via telephone Note  I discussed the limitations, risks, security and privacy concerns of performing an evaluation and management service by telephone and the availability of in person appointments. The patient was identified with two identifiers. Pt.expressed understanding and agreed to proceed. Pt. Is at home. Dr. Livia Snellen is in his office.  Follow Up Instructions:   I discussed the assessment and treatment plan with the patient. The patient was provided an opportunity to ask questions and all were answered. The patient agreed with the plan and demonstrated an understanding of the instructions.   The patient was advised to call back or seek an in-person evaluation if the symptoms worsen or if the condition fails to improve as anticipated.   Total minutes including chart review and phone contact time: 7   Follow up plan: Return if symptoms worsen or fail to improve.  Claretta Fraise, MD [place]

## 2021-02-24 ENCOUNTER — Telehealth: Payer: Self-pay | Admitting: Family Medicine

## 2021-02-24 NOTE — Telephone Encounter (Signed)
Left message for patient to call back and schedule Medicare Annual Wellness Visit (AWV) to be completed by video or phone.   Last WTM 10/10/2019   Please schedule at anytime with [place]  45 minute appointment  Any questions, please contact me at [phone]

## 2021-03-30 ENCOUNTER — Ambulatory Visit (INDEPENDENT_AMBULATORY_CARE_PROVIDER_SITE_OTHER): Payer: Medicare HMO

## 2021-03-30 VITALS — Ht 68.0 in | Wt 172.0 lb

## 2021-03-30 DIAGNOSIS — Z0000 Encounter for general adult medical examination without abnormal findings: Secondary | ICD-10-CM

## 2021-03-30 NOTE — Progress Notes (Signed)
Subjective:   Michael Joseph is a 71 y.o. male who presents for an Initial Medicare Annual Wellness Visit. Virtual Visit via Telephone Note  I connected with  Michael Joseph on 03/30/21 at  8:15 AM EST by telephone and verified that I am speaking with the correct person using two identifiers.  Location: Patient: HOME Provider: [facility] Persons participating in the virtual visit: patient/Nurse Health Advisor   I discussed the limitations, risks, security and privacy concerns of performing an evaluation and management service by telephone and the availability of in person appointments. The patient expressed understanding and agreed to proceed.  Interactive audio and video telecommunications were attempted between this nurse and patient, however failed, due to patient having technical difficulties OR patient did not have access to video capability.  We continued and completed visit with audio only.  Some vital signs may be absent or patient reported.   [name], LPN  Review of Systems     Cardiac Risk Factors include: advanced age (>40mn, >>80women);male gender;sedentary lifestyle     Objective:    Today's Vitals   03/30/21 0816  Weight: 172 lb (78 kg)  Height: '5\' 8"'  (1.727 m)   Body mass index is 26.15 kg/m.  Advanced Directives 03/30/2021 08/13/2013  Does Patient Have a Medical Advance Directive? Yes Patient does not have advance directive  Type of Advance Directive [place] -  Copy of [place] Chart? No - copy requested -  Pre-existing out of facility DNR order (yellow form or pink MOST form) - No    Current Medications (verified) Outpatient Encounter Medications as of 03/30/2021  Medication Sig   albuterol (PROAIR HFA) 108 (90 Base) MCG/ACT inhaler Inhale 2 puffs into the lungs every 6 (six) hours as needed for wheezing or shortness of breath. 30-45 min prior to exercise Need to be seen    fexofenadine-pseudoephedrine (ALLEGRA-D 24) 180-240 MG 24 hr tablet Take 1 tablet by mouth every evening. For allergy and congestion   fluticasone (FLONASE) 50 MCG/ACT nasal spray Place 2 sprays into both nostrils daily.   azithromycin (ZITHROMAX Z-PAK) 250 MG tablet Take two right away Then one a day for the next 4 days. (Patient not taking: Reported on 03/30/2021)   finasteride (PROSCAR) 5 MG tablet Take 1 tablet (5 mg total) by mouth daily. (Patient not taking: Reported on 03/30/2021)   tamsulosin (FLOMAX) 0.4 MG CAPS capsule Take 2 capsules (0.8 mg total) by mouth at bedtime. For urine flow and prostate (Patient not taking: Reported on 03/30/2021)   No facility-administered encounter medications on file as of 03/30/2021.    Allergies (verified) Amoxicillin   History: Past Medical History:  Diagnosis Date   Allergy    Environmental allergies    Obstructive sleep apnea    Personal history of colonic polyps - adenoma 08/28/2013   Past Surgical History:  Procedure Laterality Date   FRACTURE SURGERY     left wrist   NASAL SEPTUM SURGERY  1987   WRIST SURGERY Left    Family History  Problem Relation Age of Onset   Diabetes Mother    Colon cancer Neg Hx    Pancreatic cancer Neg Hx    Rectal cancer Neg Hx    Stomach cancer Neg Hx    Social History   Socioeconomic History   Marital status: Married    Spouse name: Michael Joseph  Number of children: 2   Years of education: Not on  file   Highest education level: Not on file  Occupational History   Not on file  Tobacco Use   Smoking status: Former    Types: Pipe    Quit date: 01/26/2003    Years since quitting: 18.1   Smokeless tobacco: Never  Substance and Sexual Activity   Alcohol use: No    Comment: occ   Drug use: No   Sexual activity: Yes  Other Topics Concern   Not on file  Social History Narrative   Married x 42 years.    1 grandchilde   Social Determinants of Radio broadcast assistant Strain: Low Risk    Difficulty of  Paying Living Expenses: Not hard at all  Food Insecurity: No Food Insecurity   Worried About Charity fundraiser in the Last Year: Never true   Arboriculturist in the Last Year: Never true  Transportation Needs: No Transportation Needs   Lack of Transportation (Medical): No   Lack of Transportation (Non-Medical): No  Physical Activity: Sufficiently Active   Days of Exercise per Week: 5 days   Minutes of Exercise per Session: 30 min  Stress: No Stress Concern Present   Feeling of Stress : Not at all  Social Connections: Socially Integrated   Frequency of Communication with Friends and Family: More than three times a week   Frequency of Social Gatherings with Friends and Family: More than three times a week   Attends Religious Services: More than 4 times per year   Active Member of [employer] or Organizations: Yes   Attends Music therapist: More than 4 times per year   Marital Status: Married    Tobacco Counseling Counseling given: Not Answered   Clinical Intake:  Pre-visit preparation completed: Yes  Pain : No/denies pain     BMI - recorded: 26.15 Nutritional Status: BMI 25 -29 Overweight Nutritional Risks: None Diabetes: No  How often do you need to have someone help you when you read instructions, pamphlets, or other written materials from your doctor or pharmacy?: 1 - Never  Diabetic?No  Interpreter Needed?: No  Information entered by :: mj Florrie Ramires, lpn   Activities of Daily Living In your present state of health, do you have any difficulty performing the following activities: 03/30/2021  Hearing? N  Vision? N  Difficulty concentrating or making decisions? N  Walking or climbing stairs? N  Dressing or bathing? N  Doing errands, shopping? N  Preparing Food and eating ? N  Using the Toilet? N  In the past six months, have you accidently leaked urine? N  Do you have problems with loss of bowel control? N  Managing your Medications? N  Managing your  Finances? N  Housekeeping or managing your Housekeeping? N  Some recent data might be hidden    Patient Care Team: Claretta Fraise, MD as PCP - General (Family Medicine)  Indicate any recent Medical Services you may have received from other than [facility] providers in the past year (date may be approximate).     Assessment:   This is a routine wellness examination for Ibraheem.  Hearing/Vision screen Hearing Screening - Comments:: No hearing issues.  Vision Screening - Comments:: Readers. [facility] [place]. 2022  Dietary issues and exercise activities discussed: Current Exercise Habits: Home exercise routine, Type of exercise: walking;Other - see comments (Tennis and Golf, walking), Time (Minutes): 30, Frequency (Times/Week): 5, Weekly Exercise (Minutes/Week): 150, Intensity: Moderate, Exercise limited by: Other - see comments (Seasonal allergies, Obstructive  sleep apnea.)   Goals Addressed             This Visit's Progress    Exercise 3x per week (30 min per time)       Continue to exercise, play tennis and golf. Do some house repairs.        Depression Screen PHQ 2/9 Scores 03/30/2021 10/10/2019 10/31/2018 12/16/2017 06/30/2017 01/23/2016 04/01/2014  PHQ - 2 Score 0 0 0 0 0 0 0    Fall Risk Fall Risk  03/30/2021 10/10/2019 10/31/2018 12/16/2017 06/30/2017  Falls in the past year? 0 0 0 0 No  Number falls in past yr: 0 - - - -  Injury with Fall? 0 - - - -  Risk for fall due to : No Fall Risks - - - -  Follow up Falls prevention discussed Falls evaluation completed - - -    FALL RISK PREVENTION PERTAINING TO THE HOME:  Any stairs in or around the home? Yes  If so, are there any without handrails? No  Home free of loose throw rugs in walkways, pet beds, electrical cords, etc? Yes  Adequate lighting in your home to reduce risk of falls? Yes   ASSISTIVE DEVICES UTILIZED TO PREVENT FALLS:  Life alert? No  Use of a cane, walker or w/c? No  Grab bars in the bathroom? Yes  Shower chair  or bench in shower? No  Elevated toilet seat or a handicapped toilet? No   TIMED UP AND GO:  Was the test performed? No .  Phone visit  Cognitive Function: MMSE - Mini Mental State Exam 10/10/2019  Orientation to time 5  Orientation to Place 5  Registration 3  Attention/ Calculation 5  Recall 3  Language- name 2 objects 2  Language- repeat 1  Language- follow 3 step command 3  Language- read & follow direction 1  Write a sentence 1  Copy design 1  Total score 30     6CIT Screen 03/30/2021  What Year? 0 points  What month? 0 points  What time? 0 points  Count back from 20 0 points  Months in reverse 0 points  Repeat phrase 0 points  Total Score 0    Immunizations Immunization History  Administered Date(s) Administered   Hepatitis B 07/15/1999, 08/21/1999, 02/08/2000   MMR 08/11/1999   Tdap 10/10/2019    TDAP status: Up to date  Flu Vaccine status: Declined, Education has been provided regarding the importance of this vaccine but patient still declined. Advised may receive this vaccine at local pharmacy or Health Dept. Aware to provide a copy of the vaccination record if obtained from local pharmacy or Health Dept. Verbalized acceptance and understanding.  Pneumococcal vaccine status: Due, Education has been provided regarding the importance of this vaccine. Advised may receive this vaccine at local pharmacy or Health Dept. Aware to provide a copy of the vaccination record if obtained from local pharmacy or Health Dept. Verbalized acceptance and understanding.  Covid-19 vaccine status: Declined, Education has been provided regarding the importance of this vaccine but patient still declined. Advised may receive this vaccine at local pharmacy or Health Dept.or vaccine clinic. Aware to provide a copy of the vaccination record if obtained from local pharmacy or Health Dept. Verbalized acceptance and understanding.  Qualifies for Shingles Vaccine? Yes   Zostavax completed No    Shingrix Completed?: No.    Education has been provided regarding the importance of this vaccine. Patient has been advised to call insurance company  to determine out of pocket expense if they have not yet received this vaccine. Advised may also receive vaccine at local pharmacy or Health Dept. Verbalized acceptance and understanding.  Screening Tests Health Maintenance  Topic Date Due   Zoster Vaccines- Shingrix (1 of 2) Never done   Pneumonia Vaccine 44+ Years old (1 - PCV) Never done   COVID-19 Vaccine (1) 04/15/2021 (Originally 02/10/1951)   COLONOSCOPY (Pts 45-31yr Insurance coverage will need to be confirmed)  05/03/2022 (Originally 08/29/2018)   TETANUS/TDAP  10/09/2029   Hepatitis C Screening  Completed   HPV VACCINES  Aged Out   INFLUENZA VACCINE  Discontinued    Health Maintenance  Health Maintenance Due  Topic Date Due   Zoster Vaccines- Shingrix (1 of 2) Never done   Pneumonia Vaccine 70 Years old (1 - PCV) Never done    Colorectal cancer screening: Type of screening: Colonoscopy. Completed 08/28/2013. Repeat every 5 years  Lung Cancer Screening: (Low Dose CT Chest recommended if Age 70-80years, 30 pack-year currently smoking OR have quit w/in 15years.) does not qualify.   Additional Screening:  Hepatitis C Screening: does qualify; Completed Due  Vision Screening: Recommended annual ophthalmology exams for early detection of glaucoma and other disorders of the eye. Is the patient up to date with their annual eye exam?  Yes  Who is the provider or what is the name of the office in which the patient attends annual eye exams? [facility] If pt is not established with a provider, would they like to be referred to a provider to establish care? No .   Dental Screening: Recommended annual dental exams for proper oral hygiene  Community Resource Referral / Chronic Care Management: CRR required this visit?  No   CCM required this visit?  No      Plan:     I have  personally reviewed and noted the following in the patients chart:   Medical and social history Use of alcohol, tobacco or illicit drugs  Current medications and supplements including opioid prescriptions. Patient is not currently taking opioid prescriptions. Functional ability and status Nutritional status Physical activity Advanced directives List of other physicians Hospitalizations, surgeries, and ER visits in previous 12 months Vitals Screenings to include cognitive, depression, and falls Referrals and appointments  In addition, I have reviewed and discussed with patient certain preventive protocols, quality metrics, and best practice recommendations. A written personalized care plan for preventive services as well as general preventive health recommendations were provided to patient.     MChriss Driver LPN   31/06/1094  Nurse Notes: Discussed Colonoscopy. Pt states he will call [facility] to schedule for his year. Discussed Shingrix and how to obtain. Pt declines other vaccines.

## 2021-03-30 NOTE — Patient Instructions (Signed)
Michael Joseph , Thank you for taking time to come for your Medicare Wellness Visit. I appreciate your ongoing commitment to your health goals. Please review the following plan we discussed and let me know if I can assist you in the future.   Screening recommendations/referrals: Colonoscopy: Done 08/28/2013 Repeat in 5 years Contact [facility] to schedule.   Recommended yearly ophthalmology/optometry visit for glaucoma screening and checkup Recommended yearly dental visit for hygiene and checkup  Vaccinations: Influenza vaccine: Declined. Pneumococcal vaccine: Declined. Tdap vaccine: Done 08/28/2013 Repeat in 5 years  Shingles vaccine: Discussed.    Covid-19: Declined  Advanced directives: Please bring a copy of your health care power of attorney and living will to the office to be added to your chart at your convenience.   Conditions/risks identified: KEEP UP THE GOOD WORK!!  Next appointment: Follow up in one year for your annual wellness visit. 2024.  Preventive Care 38 Years and Older, Male  Preventive care refers to lifestyle choices and visits with your health care provider that can promote health and wellness. What does preventive care include? A yearly physical exam. This is also called an annual well check. Dental exams once or twice a year. Routine eye exams. Ask your health care provider how often you should have your eyes checked. Personal lifestyle choices, including: Daily care of your teeth and gums. Regular physical activity. Eating a healthy diet. Avoiding tobacco and drug use. Limiting alcohol use. Practicing safe sex. Taking low doses of aspirin every day. Taking vitamin and mineral supplements as recommended by your health care provider. What happens during an annual well check? The services and screenings done by your health care provider during your annual well check will depend on your age, overall health, lifestyle risk factors, and family history of  disease. Counseling  Your health care provider may ask you questions about your: Alcohol use. Tobacco use. Drug use. Emotional well-being. Home and relationship well-being. Sexual activity. Eating habits. History of falls. Memory and ability to understand (cognition). Work and work Statistician. Screening  You may have the following tests or measurements: Height, weight, and BMI. Blood pressure. Lipid and cholesterol levels. These may be checked every 5 years, or more frequently if you are over 68 years old. Skin check. Lung cancer screening. You may have this screening every year starting at age 31 if you have a 30-pack-year history of smoking and currently smoke or have quit within the past 15 years. Fecal occult blood test (FOBT) of the stool. You may have this test every year starting at age 47. Flexible sigmoidoscopy or colonoscopy. You may have a sigmoidoscopy every 5 years or a colonoscopy every 10 years starting at age 39. Prostate cancer screening. Recommendations will vary depending on your family history and other risks. Hepatitis C blood test. Hepatitis B blood test. Sexually transmitted disease (STD) testing. Diabetes screening. This is done by checking your blood sugar (glucose) after you have not eaten for a while (fasting). You may have this done every 1-3 years. Abdominal aortic aneurysm (AAA) screening. You may need this if you are a current or former smoker. Osteoporosis. You may be screened starting at age 39 if you are at high risk. Talk with your health care provider about your test results, treatment options, and if necessary, the need for more tests. Vaccines  Your health care provider may recommend certain vaccines, such as: Influenza vaccine. This is recommended every year. Tetanus, diphtheria, and acellular pertussis (Tdap, Td) vaccine. You may need  a Td booster every 10 years. Zoster vaccine. You may need this after age 69. Pneumococcal 13-valent  conjugate (PCV13) vaccine. One dose is recommended after age 13. Pneumococcal polysaccharide (PPSV23) vaccine. One dose is recommended after age 58. Talk to your health care provider about which screenings and vaccines you need and how often you need them. This information is not intended to replace advice given to you by your health care provider. Make sure you discuss any questions you have with your health care provider. Document Released: 02/07/2015 Document Revised: 10/01/2015 Document Reviewed: 11/12/2014 Elsevier Interactive Patient Education  2017 [place] Prevention in the Home Falls can cause injuries. They can happen to people of all ages. There are many things you can do to make your home safe and to help prevent falls. What can I do on the outside of my home? Regularly fix the edges of walkways and driveways and fix any cracks. Remove anything that might make you trip as you walk through a door, such as a raised step or threshold. Trim any bushes or trees on the path to your home. Use bright outdoor lighting. Clear any walking paths of anything that might make someone trip, such as rocks or tools. Regularly check to see if handrails are loose or broken. Make sure that both sides of any steps have handrails. Any raised decks and porches should have guardrails on the edges. Have any leaves, snow, or ice cleared regularly. Use sand or salt on walking paths during winter. Clean up any spills in your garage right away. This includes oil or grease spills. What can I do in the bathroom? Use night lights. Install grab bars by the toilet and in the tub and shower. Do not use towel bars as grab bars. Use non-skid mats or decals in the tub or shower. If you need to sit down in the shower, use a plastic, non-slip stool. Keep the floor dry. Clean up any water that spills on the floor as soon as it happens. Remove soap buildup in the tub or shower regularly. Attach bath mats  securely with double-sided non-slip rug tape. Do not have throw rugs and other things on the floor that can make you trip. What can I do in the bedroom? Use night lights. Make sure that you have a light by your bed that is easy to reach. Do not use any sheets or blankets that are too big for your bed. They should not hang down onto the floor. Have a firm chair that has side arms. You can use this for support while you get dressed. Do not have throw rugs and other things on the floor that can make you trip. What can I do in the kitchen? Clean up any spills right away. Avoid walking on wet floors. Keep items that you use a lot in easy-to-reach places. If you need to reach something above you, use a strong step stool that has a grab bar. Keep electrical cords out of the way. Do not use floor polish or wax that makes floors slippery. If you must use wax, use non-skid floor wax. Do not have throw rugs and other things on the floor that can make you trip. What can I do with my stairs? Do not leave any items on the stairs. Make sure that there are handrails on both sides of the stairs and use them. Fix handrails that are broken or loose. Make sure that handrails are as long as the stairways. Check  any carpeting to make sure that it is firmly attached to the stairs. Fix any carpet that is loose or worn. Avoid having throw rugs at the top or bottom of the stairs. If you do have throw rugs, attach them to the floor with carpet tape. Make sure that you have a light switch at the top of the stairs and the bottom of the stairs. If you do not have them, ask someone to add them for you. What else can I do to help prevent falls? Wear shoes that: Do not have high heels. Have rubber bottoms. Are comfortable and fit you well. Are closed at the toe. Do not wear sandals. If you use a stepladder: Make sure that it is fully opened. Do not climb a closed stepladder. Make sure that both sides of the stepladder  are locked into place. Ask someone to hold it for you, if possible. Clearly mark and make sure that you can see: Any grab bars or handrails. First and last steps. Where the edge of each step is. Use tools that help you move around (mobility aids) if they are needed. These include: Canes. Walkers. Scooters. Crutches. Turn on the lights when you go into a dark area. Replace any light bulbs as soon as they burn out. Set up your furniture so you have a clear path. Avoid moving your furniture around. If any of your floors are uneven, fix them. If there are any pets around you, be aware of where they are. Review your medicines with your doctor. Some medicines can make you feel dizzy. This can increase your chance of falling. Ask your doctor what other things that you can do to help prevent falls. This information is not intended to replace advice given to you by your health care provider. Make sure you discuss any questions you have with your health care provider. Document Released: 11/07/2008 Document Revised: 06/19/2015 Document Reviewed: 02/15/2014 Elsevier Interactive Patient Education  2017 [employer].

## 2021-04-07 ENCOUNTER — Ambulatory Visit (INDEPENDENT_AMBULATORY_CARE_PROVIDER_SITE_OTHER): Payer: Medicare HMO | Admitting: Family Medicine

## 2021-04-07 ENCOUNTER — Encounter: Payer: Self-pay | Admitting: Family Medicine

## 2021-04-07 ENCOUNTER — Other Ambulatory Visit (HOSPITAL_COMMUNITY)
Admission: RE | Admit: 2021-04-07 | Discharge: 2021-04-07 | Disposition: A | Discharge status: Home / Self Care | Payer: Medicare HMO | Source: Ambulatory Visit | Attending: Family Medicine | Admitting: Family Medicine

## 2021-04-07 VITALS — BP 118/67 | HR 68 | Temp 98.8°F | Ht 68.0 in | Wt 170.0 lb

## 2021-04-07 DIAGNOSIS — L989 Disorder of the skin and subcutaneous tissue, unspecified: Secondary | ICD-10-CM

## 2021-04-07 DIAGNOSIS — L988 Other specified disorders of the skin and subcutaneous tissue: Secondary | ICD-10-CM | POA: Diagnosis not present

## 2021-04-07 DIAGNOSIS — L820 Inflamed seborrheic keratosis: Secondary | ICD-10-CM | POA: Diagnosis not present

## 2021-04-07 NOTE — Progress Notes (Signed)
?  ? ?Subjective:  ?Patient ID: Michael Joseph, male    DOB: May 19, 1950, 71 y.o.   MRN: [id number] ? ?Patient Care Team: ?Claretta Fraise, MD as PCP - General (Family Medicine)  ? ?Chief Complaint:  Nevus (back) ? ? ?HPI: ?Michael Joseph is a 71 y.o. male presenting on 04/07/2021 for Nevus (back) ? ? ?Pt presents today with complaints of skin lesion which is changing in size and shape. States tender to touch and inflamed at times. He denies fever, chills, fatigue, weight loss, or abnormal bleeding or bruising.  ? ? ? ?Relevant past medical, surgical, family, and social history reviewed and updated as indicated.  ?Allergies and medications reviewed and updated. Data reviewed: Chart in Epic. ? ? ?Past Medical History:  ?Diagnosis Date  ? Allergy   ? Environmental allergies   ? Obstructive sleep apnea   ? Personal history of colonic polyps - adenoma 08/28/2013  ? ? ?Past Surgical History:  ?Procedure Laterality Date  ? FRACTURE SURGERY    ? left wrist  ? NASAL SEPTUM SURGERY  1987  ? WRIST SURGERY Left   ? ? ?Social History  ? ?Socioeconomic History  ? Marital status: Married  ?  Spouse name: Manuela Schwartz  ? Number of children: 2  ? Years of education: Not on file  ? Highest education level: Not on file  ?Occupational History  ? Not on file  ?Tobacco Use  ? Smoking status: Former  ?  Types: Pipe  ?  Quit date: 01/26/2003  ?  Years since quitting: 18.2  ? Smokeless tobacco: Never  ?Substance and Sexual Activity  ? Alcohol use: No  ?  Comment: occ  ? Drug use: No  ? Sexual activity: Yes  ?Other Topics Concern  ? Not on file  ?Social History Narrative  ? Married x 42 years.   ? 1 grandchildren  ? ?Social Determinants of Health  ? ?Financial Resource Strain: Low Risk   ? Difficulty of Paying Living Expenses: Not hard at all  ?Food Insecurity: No Food Insecurity  ? Worried About Charity fundraiser in the Last Year: Never true  ? Ran Out of Food in the Last Year: Never true  ?Transportation Needs: No Transportation Needs  ? Lack of  Transportation (Medical): No  ? Lack of Transportation (Non-Medical): No  ?Physical Activity: Sufficiently Active  ? Days of Exercise per Week: 5 days  ? Minutes of Exercise per Session: 30 min  ?Stress: No Stress Concern Present  ? Feeling of Stress : Not at all  ?Social Connections: Socially Integrated  ? Frequency of Communication with Friends and Family: More than three times a week  ? Frequency of Social Gatherings with Friends and Family: More than three times a week  ? Attends Religious Services: More than 4 times per year  ? Active Member of Clubs or Organizations: Yes  ? Attends Archivist Meetings: More than 4 times per year  ? Marital Status: Married  ?Intimate Partner Violence: Not At Risk  ? Fear of Current or Ex-Partner: No  ? Emotionally Abused: No  ? Physically Abused: No  ? Sexually Abused: No  ? ? ?Outpatient Encounter Medications as of 04/07/2021  ?Medication Sig  ? albuterol (PROAIR HFA) 108 (90 Base) MCG/ACT inhaler Inhale 2 puffs into the lungs every 6 (six) hours as needed for wheezing or shortness of breath. 30-45 min prior to exercise Need to be seen  ? fexofenadine-pseudoephedrine (ALLEGRA-D 24) 180-240 MG 24 hr tablet  Take 1 tablet by mouth every evening. For allergy and congestion  ? fluticasone (FLONASE) 50 MCG/ACT nasal spray Place 2 sprays into both nostrils daily.  ? finasteride (PROSCAR) 5 MG tablet Take 1 tablet (5 mg total) by mouth daily. (Patient not taking: Reported on 03/30/2021)  ? tamsulosin (FLOMAX) 0.4 MG CAPS capsule Take 2 capsules (0.8 mg total) by mouth at bedtime. For urine flow and prostate (Patient not taking: Reported on 03/30/2021)  ? [DISCONTINUED] azithromycin (ZITHROMAX Z-PAK) 250 MG tablet Take two right away Then one a day for the next 4 days. (Patient not taking: Reported on 03/30/2021)  ? ?No facility-administered encounter medications on file as of 04/07/2021.  ? ? ?Allergies  ?Allergen Reactions  ? Amoxicillin   ?  Skin rash  ? ? ?Review of Systems   ?Constitutional:  Negative for activity change, appetite change, chills, diaphoresis, fatigue, fever and unexpected weight change.  ?HENT: Negative.    ?Eyes: Negative.   ?Respiratory:  Negative for cough, chest tightness and shortness of breath.   ?Cardiovascular:  Negative for chest pain, palpitations and leg swelling.  ?Gastrointestinal:  Negative for abdominal pain, blood in stool, constipation, diarrhea, nausea and vomiting.  ?Endocrine: Negative.   ?Genitourinary:  Negative for dysuria, frequency and urgency.  ?Musculoskeletal:  Negative for arthralgias and myalgias.  ?Skin:  Positive for color change. Negative for pallor, rash and wound.  ?     Skin lesion  ?Allergic/Immunologic: Negative.   ?Neurological:  Negative for dizziness, tremors, seizures, syncope, facial asymmetry, speech difficulty, weakness, light-headedness, numbness and headaches.  ?Hematological: Negative.   ?Psychiatric/Behavioral:  Negative for confusion, hallucinations, sleep disturbance and suicidal ideas.   ?All other systems reviewed and are negative. ? ?   ? ?Objective:  ?BP 118/67   Pulse 68   Temp 98.8 ?F (37.1 ?C)   Ht _0  (1.727 m)   Wt 170 lb (77.1 kg)   SpO2 96%   BMI 25.85 kg/m?   ? ?Wt Readings from Last 3 Encounters:  ?04/07/21 170 lb (77.1 kg)  ?03/30/21 172 lb (78 kg)  ?10/10/19 179 lb (81.2 kg)  ? ? ?Physical Exam ?Vitals and nursing note reviewed.  ?Constitutional:   ?   General: He is not in acute distress. ?   Appearance: Normal appearance. He is normal weight. He is not toxic-appearing or diaphoretic.  ?HENT:  ?   Head: Normocephalic and atraumatic.  ?Eyes:  ?   Pupils: Pupils are equal, round, and reactive to light.  ?Cardiovascular:  ?   Rate and Rhythm: Normal rate and regular rhythm.  ?   Heart sounds: Normal heart sounds.  ?Pulmonary:  ?   Effort: Pulmonary effort is normal.  ?Musculoskeletal:  ?   Right lower leg: No edema.  ?   Left lower leg: No edema.  ?Skin: ?   General: Skin is warm and dry.  ?    Capillary Refill: Capillary refill takes less than 2 seconds.  ?   Findings: Erythema and lesion present.  ? ?    ?Neurological:  ?   General: No focal deficit present.  ?   Mental Status: He is alert and oriented to person, place, and time.  ?Psychiatric:     ?   Mood and Affect: Mood normal.     ?   Behavior: Behavior normal.     ?   Thought Content: Thought content normal.     ?   Judgment: Judgment normal.  ? ? ? ?Results for  orders placed or performed in visit on 10/31/18  ?CBC with Differential/Platelet  ?Result Value Ref Range  ? WBC 5.7 3.4 - 10.8 x10E3/uL  ? RBC 5.55 4.14 - 5.80 x10E6/uL  ? Hemoglobin 15.4 13.0 - 17.7 g/dL  ? Hematocrit 45.5 37.5 - 51.0 %  ? MCV 82 79 - 97 fL  ? MCH 27.7 26.6 - 33.0 pg  ? MCHC 33.8 31.5 - 35.7 g/dL  ? RDW 12.7 11.6 - 15.4 %  ? Platelets 301 150 - 450 x10E3/uL  ? Neutrophils 49 Not Estab. %  ? Lymphs 36 Not Estab. %  ? Monocytes 7 Not Estab. %  ? Eos 7 Not Estab. %  ? Basos 1 Not Estab. %  ? Neutrophils Absolute 2.8 1.4 - 7.0 x10E3/uL  ? Lymphocytes Absolute 2.1 0.7 - 3.1 x10E3/uL  ? Monocytes Absolute 0.4 0.1 - 0.9 x10E3/uL  ? EOS (ABSOLUTE) 0.4 0.0 - 0.4 x10E3/uL  ? Basophils Absolute 0.1 0.0 - 0.2 x10E3/uL  ? Immature Granulocytes 0 Not Estab. %  ? Immature Grans (Abs) 0.0 0.0 - 0.1 x10E3/uL  ?CMP14+EGFR  ?Result Value Ref Range  ? Glucose 99 65 - 99 mg/dL  ? BUN 15 8 - 27 mg/dL  ? Creatinine, Ser 0.88 0.76 - 1.27 mg/dL  ? GFR calc non Af Amer 89 >59 mL/min/1.73  ? GFR calc Af Amer 103 >59 mL/min/1.73  ? BUN/Creatinine Ratio 17 10 - 24  ? Sodium 139 134 - 144 mmol/L  ? Potassium 4.2 3.5 - 5.2 mmol/L  ? Chloride 104 96 - 106 mmol/L  ? CO2 23 20 - 29 mmol/L  ? Calcium 9.5 8.6 - 10.2 mg/dL  ? Total Protein 6.8 6.0 - 8.5 g/dL  ? Albumin 4.2 3.8 - 4.8 g/dL  ? Globulin, Total 2.6 1.5 - 4.5 g/dL  ? Albumin/Globulin Ratio 1.6 1.2 - 2.2  ? Bilirubin Total 0.6 0.0 - 1.2 mg/dL  ? Alkaline Phosphatase 79 39 - 117 IU/L  ? AST 36 0 - 40 IU/L  ? ALT 24 0 - 44 IU/L  ?Lipid panel  ?Result  Value Ref Range  ? Cholesterol, Total 195 100 - 199 mg/dL  ? Triglycerides 175 (H) 0 - 149 mg/dL  ? HDL 40 >39 mg/dL  ? VLDL Cholesterol Cal 31 5 - 40 mg/dL  ? LDL Chol Calc (NIH) 124 (H) 0 - 99 mg/dL  ? Chol/HDL

## 2021-04-09 LAB — SURGICAL PATHOLOGY

## 2021-06-28 DIAGNOSIS — Z20822 Contact with and (suspected) exposure to covid-19: Secondary | ICD-10-CM | POA: Diagnosis not present

## 2021-07-01 ENCOUNTER — Encounter: Payer: Self-pay | Admitting: Family Medicine

## 2021-07-01 ENCOUNTER — Ambulatory Visit (INDEPENDENT_AMBULATORY_CARE_PROVIDER_SITE_OTHER): Payer: Medicare HMO | Admitting: Family Medicine

## 2021-07-01 VITALS — BP 124/76 | HR 71 | Temp 98.4°F | Ht 68.0 in | Wt 166.2 lb

## 2021-07-01 DIAGNOSIS — R5383 Other fatigue: Secondary | ICD-10-CM

## 2021-07-01 DIAGNOSIS — R350 Frequency of micturition: Secondary | ICD-10-CM | POA: Diagnosis not present

## 2021-07-01 DIAGNOSIS — R61 Generalized hyperhidrosis: Secondary | ICD-10-CM

## 2021-07-01 DIAGNOSIS — D649 Anemia, unspecified: Secondary | ICD-10-CM | POA: Diagnosis not present

## 2021-07-01 DIAGNOSIS — N401 Enlarged prostate with lower urinary tract symptoms: Secondary | ICD-10-CM | POA: Diagnosis not present

## 2021-07-01 NOTE — Progress Notes (Signed)
Subjective: CC: Fatigue, night sweats PCP: Claretta Fraise, MD [id number] Michael Joseph is a 71 y.o. male presenting to clinic today for:  1.  Fatigue, night sweats Patient reports about 2 weeks ago he became ill.  He was having myalgia, night sweats, fatigue.  He never had a fever.  He was tested for COVID on Sunday and this was negative.  He had not been tested for COVID when symptoms onset nor any other infectious disease.  He does not report any cough, congestion, runny nose.  He does report decreased appetite since he has been ill.  No new onset of low back pain.  He has chronic prostate symptoms that are moderately controlled with his finasteride and Flomax.  No known family history of thyroid disorders.   ROS: Per HPI  Allergies  Allergen Reactions   Amoxicillin     Skin rash   Past Medical History:  Diagnosis Date   Allergy    Environmental allergies    Obstructive sleep apnea    Personal history of colonic polyps - adenoma 08/28/2013    Current Outpatient Medications:    albuterol (PROAIR HFA) 108 (90 Base) MCG/ACT inhaler, Inhale 2 puffs into the lungs every 6 (six) hours as needed for wheezing or shortness of breath. 30-45 min prior to exercise Need to be seen, Disp: 8.5 g, Rfl: 0   fexofenadine-pseudoephedrine (ALLEGRA-D 24) 180-240 MG 24 hr tablet, Take 1 tablet by mouth every evening. For allergy and congestion, Disp: 30 tablet, Rfl: 11   fluticasone (FLONASE) 50 MCG/ACT nasal spray, Place 2 sprays into both nostrils daily., Disp: 48 g, Rfl: 1   finasteride (PROSCAR) 5 MG tablet, Take 1 tablet (5 mg total) by mouth daily. (Patient not taking: Reported on 03/30/2021), Disp: 90 tablet, Rfl: 1   tamsulosin (FLOMAX) 0.4 MG CAPS capsule, Take 2 capsules (0.8 mg total) by mouth at bedtime. For urine flow and prostate (Patient not taking: Reported on 03/30/2021), Disp: 180 capsule, Rfl: 3 Social History   Socioeconomic History   Marital status: Married    Spouse name: Manuela Schwartz    Number of children: 2   Years of education: Not on file   Highest education level: Not on file  Occupational History   Not on file  Tobacco Use   Smoking status: Former    Types: Pipe    Quit date: 01/26/2003    Years since quitting: 18.4   Smokeless tobacco: Never  Substance and Sexual Activity   Alcohol use: No    Comment: occ   Drug use: No   Sexual activity: Yes  Other Topics Concern   Not on file  Social History Narrative   Married x 42 years.    1 grandchildren   Social Determinants of Radio broadcast assistant Strain: Low Risk    Difficulty of Paying Living Expenses: Not hard at all  Food Insecurity: No Food Insecurity   Worried About Charity fundraiser in the Last Year: Never true   Arboriculturist in the Last Year: Never true  Transportation Needs: No Transportation Needs   Lack of Transportation (Medical): No   Lack of Transportation (Non-Medical): No  Physical Activity: Sufficiently Active   Days of Exercise per Week: 5 days   Minutes of Exercise per Session: 30 min  Stress: No Stress Concern Present   Feeling of Stress : Not at all  Social Connections: Socially Integrated   Frequency of Communication with Friends and Family: More than three  times a week   Frequency of Social Gatherings with Friends and Family: More than three times a week   Attends Religious Services: More than 4 times per year   Active Member of [employer] or Organizations: Yes   Attends Music therapist: More than 4 times per year   Marital Status: Married  Human resources officer Violence: Not At Risk   Fear of Current or Ex-Partner: No   Emotionally Abused: No   Physically Abused: No   Sexually Abused: No   Family History  Problem Relation Age of Onset   Diabetes Mother    Colon cancer Neg Hx    Pancreatic cancer Neg Hx    Rectal cancer Neg Hx    Stomach cancer Neg Hx     Objective: Office vital signs reviewed. BP 124/76   Pulse 71   Temp 98.4 F (36.9 C)   Ht '5\' 8"'   (1.727 m)   Wt 166 lb 3.2 oz (75.4 kg)   SpO2 97%   BMI 25.27 kg/m   Physical Examination:  General: Awake, alert, nontoxic elderly male, No acute distress HEENT: Sclera white.  No exophthalmos.  No goiter.  No cervical or supraclavicular lymphadenopathy.  No thyromegaly or thyroid nodules or masses Cardio: regular rate and rhythm, S1S2 heard, no murmurs appreciated Pulm: clear to auscultation bilaterally, no wheezes, rhonchi or rales; normal work of breathing on room air GI: soft, non-tender, non-distended, bowel sounds present x4, no hepatomegaly, no splenomegaly, no masses MSK: Normal gait and station  Assessment/ Plan: 71 y.o. male   Night sweats - Plan: CMP14+EGFR, CBC, TSH, T4, Free, PSA  Fatigue, unspecified type - Plan: CMP14+EGFR, CBC, TSH, T4, Free  Benign prostatic hyperplasia with urinary frequency - Plan: PSA  Uncertain etiology of his night sweats but I suspect that this is related to acute illness.  The illness seem to be resolving but has not totally gone away.  Will check basic labs including thyroid labs and CBC.  PSA was collected since this has not been collected in some time now and patient is being treated with prostate meds.  He is not demonstrating any red flag signs or symptoms to suggest malignancy at this time but I certainly would not hesitate to further evaluate if he has ongoing night sweats in the absence of other symptoms.  Advised him to follow-up in the next few weeks with with PCP for full physical and reevaluation  No orders of the defined types were placed in this encounter.  No orders of the defined types were placed in this encounter.    Janora Norlander, DO [place] [phone]

## 2021-07-01 NOTE — Patient Instructions (Signed)
I suspect much of your symptoms are consistent with a postviral syndrome. Will check some basic labs to rule out any pathologic processes.  Follow up with Dr Livia Snellen for routine physical in a few weeks and if no better by then, recommend consideration for further work up.

## 2021-07-02 LAB — CBC
Hematocrit: 38.3 % (ref 37.5–51.0)
Hemoglobin: 12.5 g/dL — ABNORMAL LOW (ref 13.0–17.7)
MCH: 27.8 pg (ref 26.6–33.0)
MCHC: 32.6 g/dL (ref 31.5–35.7)
MCV: 85 fL (ref 79–97)
Platelets: 679 10*3/uL — ABNORMAL HIGH (ref 150–450)
RBC: 4.5 x10E6/uL (ref 4.14–5.80)
RDW: 12.4 % (ref 11.6–15.4)
WBC: 9.9 10*3/uL (ref 3.4–10.8)

## 2021-07-02 LAB — CMP14+EGFR
ALT: 36 IU/L (ref 0–44)
AST: 20 IU/L (ref 0–40)
Albumin/Globulin Ratio: 1.2 (ref 1.2–2.2)
Albumin: 3.3 g/dL — ABNORMAL LOW (ref 3.8–4.8)
Alkaline Phosphatase: 149 IU/L — ABNORMAL HIGH (ref 44–121)
BUN/Creatinine Ratio: 15 (ref 10–24)
BUN: 11 mg/dL (ref 8–27)
Bilirubin Total: 0.4 mg/dL (ref 0.0–1.2)
CO2: 24 mmol/L (ref 20–29)
Calcium: 8.8 mg/dL (ref 8.6–10.2)
Chloride: 100 mmol/L (ref 96–106)
Creatinine, Ser: 0.72 mg/dL — ABNORMAL LOW (ref 0.76–1.27)
Globulin, Total: 2.8 g/dL (ref 1.5–4.5)
Glucose: 104 mg/dL — ABNORMAL HIGH (ref 70–99)
Potassium: 5 mmol/L (ref 3.5–5.2)
Sodium: 137 mmol/L (ref 134–144)
Total Protein: 6.1 g/dL (ref 6.0–8.5)
eGFR: 99 mL/min/{1.73_m2} (ref >59)

## 2021-07-02 LAB — PSA: Prostate Specific Ag, Serum: 7.7 ng/mL — ABNORMAL HIGH (ref 0.0–4.0)

## 2021-07-02 LAB — TSH: TSH: 2.12 u[IU]/mL (ref 0.450–4.500)

## 2021-07-02 LAB — T4, FREE: Free T4: 1.22 ng/dL (ref 0.82–1.77)

## 2021-07-06 ENCOUNTER — Other Ambulatory Visit: Payer: Self-pay | Admitting: Family Medicine

## 2021-07-06 NOTE — Addendum Note (Signed)
Addended by: Everlean Cherry on: 07/06/2021 01:10 PM   Modules accepted: Orders

## 2021-07-10 ENCOUNTER — Telehealth: Payer: Self-pay

## 2021-07-10 DIAGNOSIS — H5200 Hypermetropia, unspecified eye: Secondary | ICD-10-CM | POA: Diagnosis not present

## 2021-07-10 DIAGNOSIS — H52229 Regular astigmatism, unspecified eye: Secondary | ICD-10-CM | POA: Diagnosis not present

## 2021-07-10 DIAGNOSIS — H524 Presbyopia: Secondary | ICD-10-CM | POA: Diagnosis not present

## 2021-07-10 NOTE — Telephone Encounter (Signed)
Pt complained of losing vision in left eye for about 2 minutes. Advised pt to be seen and I scheduled

## 2021-07-12 ENCOUNTER — Encounter (HOSPITAL_COMMUNITY): Payer: Self-pay

## 2021-07-12 ENCOUNTER — Other Ambulatory Visit: Payer: Self-pay

## 2021-07-12 ENCOUNTER — Emergency Department (HOSPITAL_COMMUNITY): Payer: Medicare HMO

## 2021-07-12 ENCOUNTER — Emergency Department (HOSPITAL_COMMUNITY)
Admission: EM | Admit: 2021-07-12 | Discharge: 2021-07-12 | Disposition: A | Discharge status: Home / Self Care | Payer: Medicare HMO | Attending: Emergency Medicine | Admitting: Emergency Medicine

## 2021-07-12 DIAGNOSIS — J329 Chronic sinusitis, unspecified: Secondary | ICD-10-CM | POA: Diagnosis not present

## 2021-07-12 DIAGNOSIS — H547 Unspecified visual loss: Secondary | ICD-10-CM

## 2021-07-12 DIAGNOSIS — H5462 Unqualified visual loss, left eye, normal vision right eye: Secondary | ICD-10-CM | POA: Diagnosis not present

## 2021-07-12 DIAGNOSIS — I6523 Occlusion and stenosis of bilateral carotid arteries: Secondary | ICD-10-CM | POA: Diagnosis not present

## 2021-07-12 DIAGNOSIS — H5460 Unqualified visual loss, one eye, unspecified: Secondary | ICD-10-CM | POA: Diagnosis not present

## 2021-07-12 DIAGNOSIS — J338 Other polyp of sinus: Secondary | ICD-10-CM | POA: Diagnosis not present

## 2021-07-12 DIAGNOSIS — H53139 Sudden visual loss, unspecified eye: Secondary | ICD-10-CM | POA: Diagnosis not present

## 2021-07-12 DIAGNOSIS — R29818 Other symptoms and signs involving the nervous system: Secondary | ICD-10-CM | POA: Diagnosis not present

## 2021-07-12 DIAGNOSIS — R9431 Abnormal electrocardiogram [ECG] [EKG]: Secondary | ICD-10-CM | POA: Diagnosis not present

## 2021-07-12 LAB — CBC WITH DIFFERENTIAL/PLATELET
Abs Immature Granulocytes: 0.03 10*3/uL (ref 0.00–0.07)
Basophils Absolute: 0 10*3/uL (ref 0.0–0.1)
Basophils Relative: 0 %
Eosinophils Absolute: 0.3 10*3/uL (ref 0.0–0.5)
Eosinophils Relative: 4 %
HCT: 39.2 % (ref 39.0–52.0)
Hemoglobin: 12.6 g/dL — ABNORMAL LOW (ref 13.0–17.0)
Immature Granulocytes: 0 %
Lymphocytes Relative: 15 %
Lymphs Abs: 1.2 10*3/uL (ref 0.7–4.0)
MCH: 27.5 pg (ref 26.0–34.0)
MCHC: 32.1 g/dL (ref 30.0–36.0)
MCV: 85.4 fL (ref 80.0–100.0)
Monocytes Absolute: 0.6 10*3/uL (ref 0.1–1.0)
Monocytes Relative: 7 %
Neutro Abs: 5.9 10*3/uL (ref 1.7–7.7)
Neutrophils Relative %: 74 %
Platelets: 456 10*3/uL — ABNORMAL HIGH (ref 150–400)
RBC: 4.59 MIL/uL (ref 4.22–5.81)
RDW: 12.6 % (ref 11.5–15.5)
WBC: 8.1 10*3/uL (ref 4.0–10.5)
nRBC: 0 % (ref 0.0–0.2)

## 2021-07-12 LAB — APTT: aPTT: 28 seconds (ref 24–36)

## 2021-07-12 LAB — BASIC METABOLIC PANEL
Anion gap: 7 (ref 5–15)
BUN: 10 mg/dL (ref 8–23)
CO2: 25 mmol/L (ref 22–32)
Calcium: 8.7 mg/dL — ABNORMAL LOW (ref 8.9–10.3)
Chloride: 105 mmol/L (ref 98–111)
Creatinine, Ser: 0.78 mg/dL (ref 0.61–1.24)
GFR, Estimated: >60 mL/min (ref >60)
Glucose, Bld: 154 mg/dL — ABNORMAL HIGH (ref 70–99)
Potassium: 4 mmol/L (ref 3.5–5.1)
Sodium: 137 mmol/L (ref 135–145)

## 2021-07-12 LAB — PROTIME-INR
INR: 1.1 (ref 0.8–1.2)
Prothrombin Time: 14.5 seconds (ref 11.4–15.2)

## 2021-07-12 IMAGING — CT CT ANGIO HEAD-NECK (W OR W/O PERF)
2 of 12 series · 6 of 34 positions shown · non-contrast
Comparison: None available

CLINICAL DATA: Neuro deficit, acute, stroke suspected. Episodes of
left eye vision loss.

EXAM:
CT ANGIOGRAPHY HEAD AND NECK
TECHNIQUE: Multidetector CT imaging of the head and neck was performed using
the standard protocol during bolus administration of intravenous
contrast. Multiplanar CT image reconstructions and MIPs were
obtained to evaluate the vascular anatomy. Carotid stenosis
measurements (when applicable) are obtained utilizing NASCET
criteria, using the distal internal carotid diameter as the
denominator.

[Series 9: cta head & neck (person_name) · axial · 0.39mm/px · z∈[-129,+91]mm · 4 of 737 slices shown]
[im 148/737  soft-tissue]
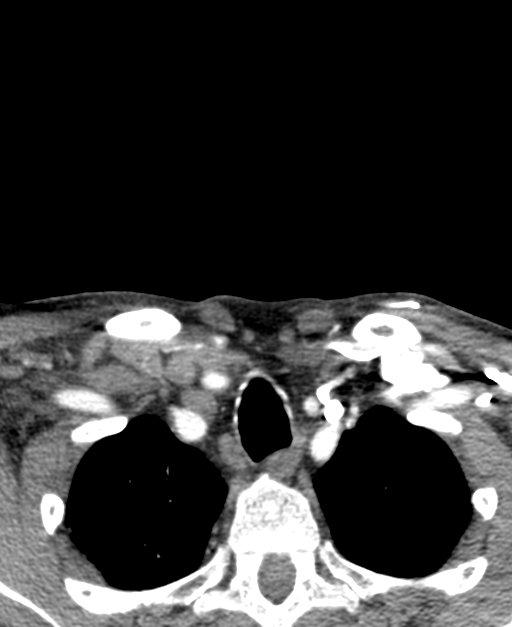
[im 295/737  bone]
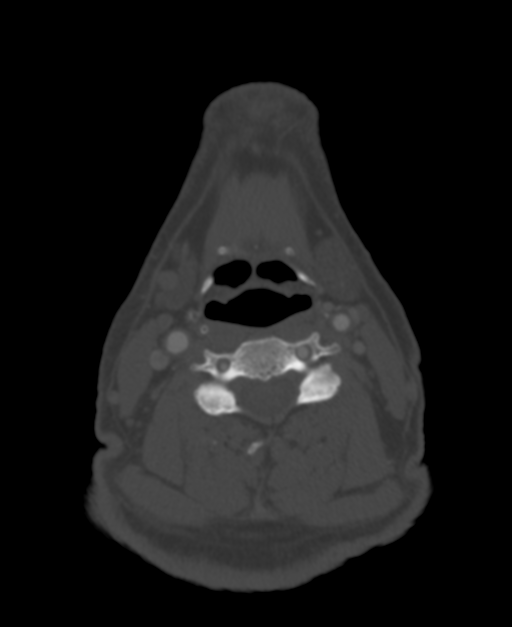
[im 442/737  soft-tissue]
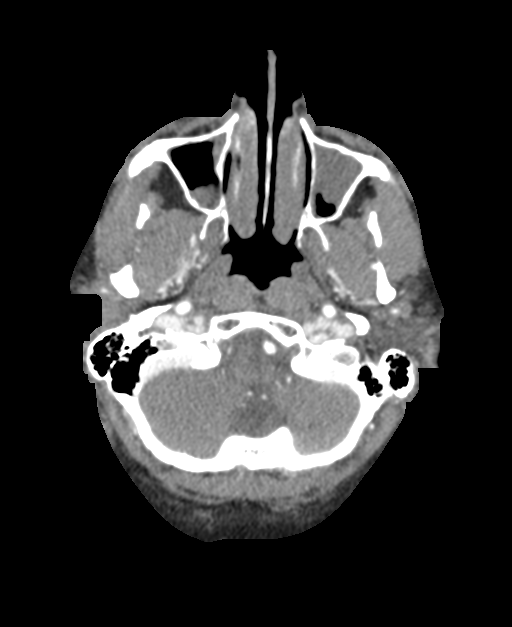
[im 589/737  bone]
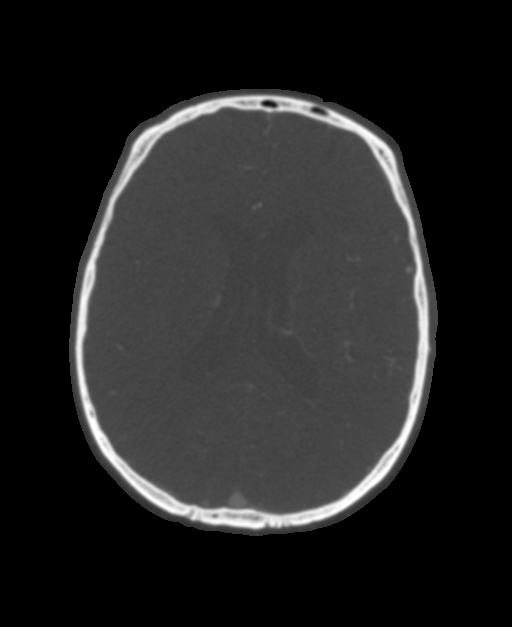

[Series 11: ax thins (person_name) · axial · 0.39mm/px · z∈[-81,+42]mm · 2 of 369 slices shown]
[im 123/369  soft-tissue]
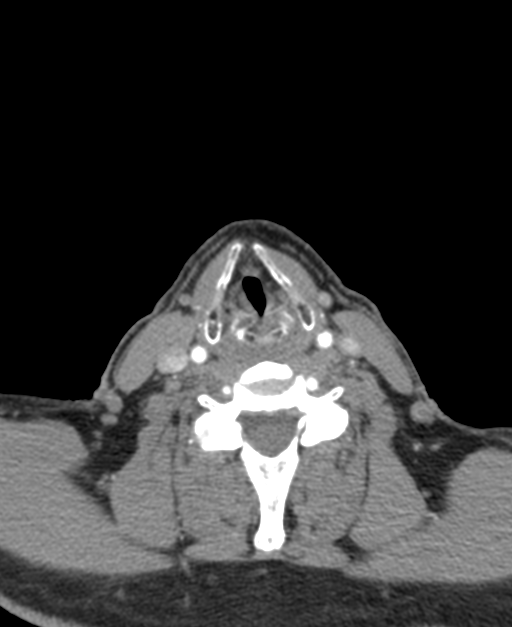
[im 246/369  soft-tissue]
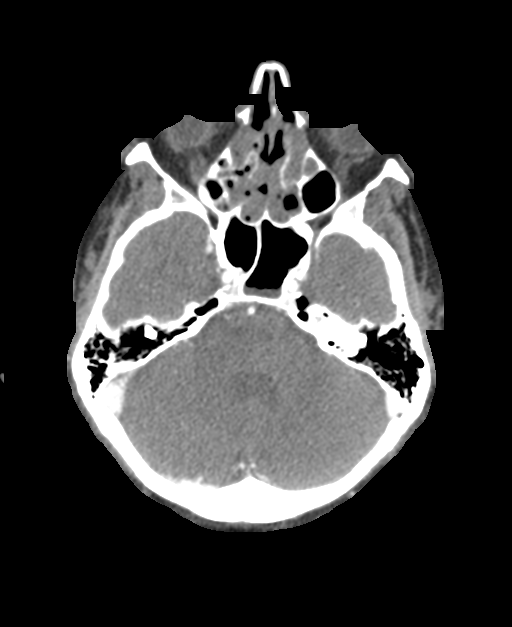

[6 of 34 positions shown; findings below may reference images not displayed]

RADIATION DOSE REDUCTION: This exam was performed according to the
departmental dose-optimization program which includes automated
exposure control, adjustment of the mA and/or kV according to
patient size and/or use of iterative reconstruction technique.

CONTRAST:  75mL OMNIPAQUE IOHEXOL 350 MG/ML SOLN
FINDINGS: CT HEAD FINDINGS

Brain: No acute infarct, hemorrhage, or mass lesion is present. The
ventricles are of normal size. No significant extraaxial fluid
collection is present. The ventricles are of normal size. The
brainstem and cerebellum are within normal limits.

Vascular: No hyperdense vessel or unexpected calcification.

Skull: Calvarium is intact. No focal lytic or blastic lesions are
present. No significant extracranial soft tissue lesion is present.

Sinuses: Scattered mucosal thickening is present throughout the
ethmoid air cells. 22 mm polyp is present in the right nasal cavity.
Polyp or mucous retention cysts are present in the inferior left
maxillary sinus. Mucosal thickening is present in both maxillary
sinuses. Mastoid air cells are clear. Some fluid is present in the
left sphenoid sinus.

Orbits: The globes and orbits are within normal limits.

Review of the MIP images confirms the above findings

CTA NECK FINDINGS

Aortic arch: 3 vessel arch configuration is present. No significant
stenosis or aneurysm is present.

Right carotid system: The right common carotid artery is within
normal limits. Minimal calcification is present at the bifurcation.
Cervical right ICA is within normal limits.

Left carotid system: The left common carotid artery is within normal
limits. Minimal calcifications present bifurcation. Cervical left
ICA is within normal limits.

Vertebral arteries: The left vertebral artery is the dominant
vessel. Both vertebral arteries originate from the subclavian
arteries without significant stenosis. No significant stenosis is
present in either vertebral artery in the neck.

Skeleton: Vertebral body heights and alignment are normal. No focal
osseous lesions are present.

Other neck: Soft tissues the neck are otherwise unremarkable.
Salivary glands are within normal limits. Thyroid is normal. No
significant adenopathy is present. No focal mucosal or submucosal
lesions are present.

Upper chest: The lung apices are clear. Thoracic inlet is within
normal limits.

Review of the MIP images confirms the above findings

CTA HEAD FINDINGS

Anterior circulation: Minimal atherosclerotic calcifications are
present within the cavernous internal carotid arteries bilaterally.
No focal stenosis is present from the skull base through the ICA
termini. The A1 and M1 segments are normal. The anterior
communicating artery is patent. MCA bifurcations are within normal
limits. ACA and MCA branch vessels are normal.

Posterior circulation: Left vertebral artery is the dominant vessel.
PICA origins are visualized and normal. Vertebrobasilar junction on
the basilar artery is normal. Both posterior cerebral arteries
originate from the basilar tip. PCA branch vessels are within normal
limits bilaterally.

Venous sinuses: The dural sinuses are patent. The straight sinus
deep cerebral veins are intact. Cortical veins are within normal
limits. No significant vascular malformation is evident.

Anatomic variants: None

Review of the MIP images confirms the above findings
IMPRESSION: 1. Minimal atherosclerotic calcifications within the cavernous
internal carotid arteries bilaterally without significant stenosis.
2. Otherwise normal CTA Circle of Willis without significant
proximal stenosis, aneurysm, or branch vessel occlusion.
3. Minimal atherosclerotic calcifications at the carotid
bifurcations bilaterally without significant stenosis.
4. Nasal polyps.
5. Paranasal sinus disease.

## 2021-07-12 MED ORDER — IOHEXOL 350 MG/ML SOLN
75.0000 mL | Freq: Once | INTRAVENOUS | Status: AC | PRN
  Administered 2021-07-12: 75 mL via INTRAVENOUS

## 2021-07-12 MED ORDER — ASPIRIN 81 MG PO CHEW
81.0000 mg | CHEWABLE_TABLET | Freq: Once | ORAL | Status: AC
  Administered 2021-07-12: 81 mg via ORAL
  Filled 2021-07-12: qty 1

## 2021-07-12 NOTE — ED Triage Notes (Addendum)
Starting Friday (6/16) Pt has had 3 episodes of vision loss to his L eye that lasts 2-3 minutes. The last episode happened at 08:30 this AM. Pt saw the optometrist after the first episode and was told his eye exam was normal. Pt reports normal vision during triage.

## 2021-07-12 NOTE — Discharge Instructions (Addendum)
As discussed, your CT scan today and your basic blood work are reassuring, however your episodes of vision loss could be secondary to transient ischemic loss of blood flow causing your symptoms.  You will need further testing including an MRI and an echocardiogram of your heart.  We have offered you admission to get these tests completed in the morning, but understand you prefer to keep your appointment with Dr. Livia Snellen tomorrow.  In the interim I strongly recommend you take a baby aspirin daily which is protective for this condition.  We have given you 1 here, take your next dose tomorrow.

## 2021-07-12 NOTE — ED Provider Notes (Signed)
[facility] Provider Note   CSN: [id number] Arrival date & time: 07/12/21  1102     History  Chief Complaint  Patient presents with   Eye Problem    Michael Joseph is a 71 y.o. male with a history of allergy and BPH presenting for evaluation of 3 distinct episodes of painless left eye vision loss which has occurred over the past 3 days.  Each episode has lasted for about 2 to 3 minutes, the first 1 occurred 2 days ago, describes a complete blackout of his entire field of vision of the left eye, it was not accompanied by pain, denies injury, denies headache.  He was seen by his ophthalmologist Dr. Truman Hayward that day and had a dilated eye exam, was told his eye exam was normal but suggested he follow-up with his PCP for further evaluation of his carotid arteries.  Since Friday he has had 2 additional episodes, yesterday had a milder episode where he did not have a complete loss of vision, stated his vision grayed out, he was able to detect edges of objects, had similar episode this morning around 830, these additional episodes also lasting 2 to 3 minutes.  He is currently symptom-free.  He denies any peripheral numbness or weakness, denies headache, denies dizziness.  He notes some mild soreness over his right anterior neck when he was shaving this morning.  Describes having increased fatigue recently, suspect that he may have had a viral illness, but is feeling better now.  The history is provided by the patient.       Home Medications Prior to Admission medications   Medication Sig Start Date End Date Taking? Authorizing Provider  albuterol (PROAIR HFA) 108 (90 Base) MCG/ACT inhaler Inhale 2 puffs into the lungs every 6 (six) hours as needed for wheezing or shortness of breath. 30-45 min prior to exercise Need to be seen 10/10/19   Claretta Fraise, MD  fexofenadine-pseudoephedrine (ALLEGRA-D 24) 180-240 MG 24 hr tablet Take 1 tablet by mouth every evening. For allergy and  congestion 02/04/21   Claretta Fraise, MD  finasteride (PROSCAR) 5 MG tablet TAKE 1 TABLET DAILY 07/06/21   Claretta Fraise, MD  fluticasone [facility]) 50 MCG/ACT nasal spray Place 2 sprays into both nostrils daily. 10/10/19   Claretta Fraise, MD  tamsulosin (FLOMAX) 0.4 MG CAPS capsule TAKE 2 CAPSULES AT BEDTIME FOR URINE FLOW AND PROSTATE 07/06/21   Claretta Fraise, MD      Allergies    Amoxicillin    Review of Systems   Review of Systems  Constitutional:  Negative for fever.  HENT:  Negative for congestion and sore throat.   Eyes:  Positive for visual disturbance.  Respiratory:  Negative for chest tightness and shortness of breath.   Cardiovascular:  Negative for chest pain.  Gastrointestinal:  Negative for abdominal pain and nausea.  Genitourinary: Negative.   Musculoskeletal:  Negative for arthralgias, joint swelling and neck pain.  Skin: Negative.  Negative for rash and wound.  Neurological:  Negative for dizziness, weakness, light-headedness, numbness and headaches.  Psychiatric/Behavioral: Negative.      Physical Exam Updated Vital Signs BP 139/77   Pulse 65   Temp 97.9 F (36.6 C) (Oral)   Resp 18   Ht '5\' 8"'$  (1.727 m)   Wt 74.8 kg   SpO2 100%   BMI 25.09 kg/m  Physical Exam Vitals and nursing note reviewed.  Constitutional:      Appearance: He is well-developed.  HENT:  Head: Normocephalic and atraumatic.  Eyes:     General: Lids are normal. No visual field deficit.    Extraocular Movements: Extraocular movements intact.     Right eye: No nystagmus.     Left eye: No nystagmus.     Conjunctiva/sclera: Conjunctivae normal.     Right eye: Right conjunctiva is not injected.     Left eye: Left conjunctiva is not injected.     Pupils: Pupils are equal, round, and reactive to light.  Neck:     Vascular: No carotid bruit.  Cardiovascular:     Rate and Rhythm: Normal rate.     Heart sounds: Normal heart sounds.  Pulmonary:     Effort: Pulmonary effort is normal.   Musculoskeletal:        General: Normal range of motion.     Cervical back: Normal range of motion and neck supple.  Lymphadenopathy:     Cervical: No cervical adenopathy.  Skin:    General: Skin is warm and dry.     Findings: No rash.  Neurological:     General: No focal deficit present.     Mental Status: He is alert and oriented to person, place, and time.     GCS: GCS eye subscore is 4. GCS verbal subscore is 5. GCS motor subscore is 6.     Cranial Nerves: Cranial nerves 2-12 are intact.     Sensory: No sensory deficit.     Coordination: Coordination normal. Heel to [facility] Test normal. Rapid alternating movements normal.     Gait: Gait normal.     Comments: Normal heel-shin, normal rapid alternating movements. Cranial nerves III-XII intact.  No pronator drift.  Psychiatric:        Speech: Speech normal.        Behavior: Behavior normal.        Thought Content: Thought content normal.     ED Results / Procedures / Treatments   Labs (all labs ordered are listed, but only abnormal results are displayed) Labs Reviewed  CBC WITH DIFFERENTIAL/PLATELET - Abnormal; Notable for the following components:      Result Value   Hemoglobin 12.6 (*)    Platelets 456 (*)    All other components within normal limits  BASIC METABOLIC PANEL - Abnormal; Notable for the following components:   Glucose, Bld 154 (*)    Calcium 8.7 (*)    All other components within normal limits  PROTIME-INR  APTT    EKG EKG Interpretation  Date/Time:  Sunday July 12 2021 16:06:17 EDT Ventricular Rate:  65 PR Interval:  167 QRS Duration: 91 QT Interval:  413 QTC Calculation: 430 R Axis:   68 Text Interpretation: Sinus rhythm Confirmed by Fredia Sorrow [phone]) on 07/12/2021 4:09:41 PM  Radiology CT ANGIO HEAD NECK W WO CM  Result Date: 07/12/2021 CLINICAL DATA:  Neuro deficit, acute, stroke suspected. Episodes of left eye vision loss. EXAM: CT ANGIOGRAPHY HEAD AND NECK TECHNIQUE: Multidetector CT  imaging of the head and neck was performed using the standard protocol during bolus administration of intravenous contrast. Multiplanar CT image reconstructions and MIPs were obtained to evaluate the vascular anatomy. Carotid stenosis measurements (when applicable) are obtained utilizing NASCET criteria, using the distal internal carotid diameter as the denominator. RADIATION DOSE REDUCTION: This exam was performed according to the departmental dose-optimization program which includes automated exposure control, adjustment of the mA and/or kV according to patient size and/or use of iterative reconstruction technique. CONTRAST:  76m OMNIPAQUE IOHEXOL  350 MG/ML SOLN COMPARISON:  None available FINDINGS: CT HEAD FINDINGS Brain: No acute infarct, hemorrhage, or mass lesion is present. The ventricles are of normal size. No significant extraaxial fluid collection is present. The ventricles are of normal size. The brainstem and cerebellum are within normal limits. Vascular: No hyperdense vessel or unexpected calcification. Skull: Calvarium is intact. No focal lytic or blastic lesions are present. No significant extracranial soft tissue lesion is present. Sinuses: Scattered mucosal thickening is present throughout the ethmoid air cells. 22 mm polyp is present in the right nasal cavity. Polyp or mucous retention cysts are present in the inferior left maxillary sinus. Mucosal thickening is present in both maxillary sinuses. Mastoid air cells are clear. Some fluid is present in the left sphenoid sinus. Orbits: The globes and orbits are within normal limits. Review of the MIP images confirms the above findings CTA NECK FINDINGS Aortic arch: 3 vessel arch configuration is present. No significant stenosis or aneurysm is present. Right carotid system: The right common carotid artery is within normal limits. Minimal calcification is present at the bifurcation. Cervical right ICA is within normal limits. Left carotid system: The  left common carotid artery is within normal limits. Minimal calcifications present bifurcation. Cervical left ICA is within normal limits. Vertebral arteries: The left vertebral artery is the dominant vessel. Both vertebral arteries originate from the subclavian arteries without significant stenosis. No significant stenosis is present in either vertebral artery in the neck. Skeleton: Vertebral body heights and alignment are normal. No focal osseous lesions are present. Other neck: Soft tissues the neck are otherwise unremarkable. Salivary glands are within normal limits. Thyroid is normal. No significant adenopathy is present. No focal mucosal or submucosal lesions are present. Upper chest: The lung apices are clear. Thoracic inlet is within normal limits. Review of the MIP images confirms the above findings CTA HEAD FINDINGS Anterior circulation: Minimal atherosclerotic calcifications are present within the cavernous internal carotid arteries bilaterally. No focal stenosis is present from the skull base through the ICA termini. The A1 and M1 segments are normal. The anterior communicating artery is patent. MCA bifurcations are within normal limits. ACA and MCA branch vessels are normal. Posterior circulation: Left vertebral artery is the dominant vessel. PICA origins are visualized and normal. Vertebrobasilar junction on the basilar artery is normal. Both posterior cerebral arteries originate from the basilar tip. PCA branch vessels are within normal limits bilaterally. Venous sinuses: The dural sinuses are patent. The straight sinus deep cerebral veins are intact. Cortical veins are within normal limits. No significant vascular malformation is evident. Anatomic variants: None Review of the MIP images confirms the above findings IMPRESSION: 1. Minimal atherosclerotic calcifications within the cavernous internal carotid arteries bilaterally without significant stenosis. 2. Otherwise normal CTA Circle of Willis  without significant proximal stenosis, aneurysm, or branch vessel occlusion. 3. Minimal atherosclerotic calcifications at the carotid bifurcations bilaterally without significant stenosis. 4. Nasal polyps. 5. Paranasal sinus disease. Electronically Signed   By: San Morelle M.D.   On: 07/12/2021 15:34    Procedures Procedures    Medications Ordered in ED Medications  aspirin chewable tablet 81 mg (has no administration in time range)  iohexol (OMNIPAQUE) 350 MG/ML injection 75 mL (75 mLs Intravenous Contrast Given 07/12/21 1459)    ED Course/ Medical Decision Making/ A&P                           Medical Decision Making Patient presenting with transient vision  loss, currently resolved, 3 discrete episodes x4 days.  He had a CTA head and neck here with no occlusive disease appreciated.  Basic lab work was also reassuring.  We discussed additional testing that he will need and that his symptoms may represent a TIA which can be the precursor morning symptoms prior to stroke, these include an MRI and echocardiogram.  He was offered admission today for having these additional tests completed.  He does have a follow-up with his PCP tomorrow and he prefers to follow-up with Dr. Quinn Axe instead.  This seems like a reasonable plan, EKG was completed prior to discharge home to confirm he has no arrhythmias.   Amount and/or Complexity of Data Reviewed Labs: ordered. Radiology: ordered.  Risk Prescription drug management.           Final Clinical Impression(s) / ED Diagnoses Final diagnoses:  Vision loss    Rx / DC Orders ED Discharge Orders     None         Landis Martins 07/12/21 1614    Isla Pence, MD 07/15/21 [phone]

## 2021-07-13 ENCOUNTER — Ambulatory Visit (INDEPENDENT_AMBULATORY_CARE_PROVIDER_SITE_OTHER): Payer: Medicare HMO | Admitting: Nurse Practitioner

## 2021-07-13 ENCOUNTER — Encounter: Payer: Self-pay | Admitting: Nurse Practitioner

## 2021-07-13 VITALS — BP 125/74 | HR 82 | Ht 68.0 in | Wt 165.0 lb

## 2021-07-13 DIAGNOSIS — G453 Amaurosis fugax: Secondary | ICD-10-CM | POA: Diagnosis not present

## 2021-07-13 NOTE — Progress Notes (Signed)
Acute Office Visit  Subjective:     Patient ID: Michael Joseph, male    DOB: [birth year], 71 y.o.   MRN: [id number]  Chief Complaint  Patient presents with   Loss of Vision    Left eye - Friday is when he noticed it first , he went to the ER Sunday    Hospitalization Follow-up    Admitted 07/12/21 - discharged 07/12/21    Eye Problem  The left eye is affected. This is a recurrent problem. The current episode started in the past 7 days. The problem occurs constantly. The problem has been unchanged. The patient is experiencing no pain. Associated symptoms include blurred vision and tingling. Pertinent negatives include no foreign body sensation, itching or photophobia. Associated symptoms comments: Spontaneous loss of visition in left eye, 3 minutes at  a time. He has tried nothing for the symptoms.     Review of Systems  Constitutional: Negative.   Eyes:  Positive for blurred vision. Negative for photophobia and itching.  Respiratory: Negative.    Cardiovascular: Negative.   Genitourinary: Negative.   Musculoskeletal:  Positive for myalgias.  Skin: Negative.  Negative for rash.  Neurological:  Positive for tingling.  All other systems reviewed and are negative.       Objective:    BP 125/74   Pulse 82   Ht '5\' 8"'$  (1.727 m)   Wt 165 lb (74.8 kg)   SpO2 95%   BMI 25.09 kg/m  BP Readings from Last 3 Encounters:  07/13/21 125/74  07/12/21 139/77  07/01/21 124/76   Wt Readings from Last 3 Encounters:  07/13/21 165 lb (74.8 kg)  07/12/21 165 lb (74.8 kg)  07/01/21 166 lb 3.2 oz (75.4 kg)      Physical Exam Vitals and nursing note reviewed.  Constitutional:      Appearance: Normal appearance.  HENT:     Head: Normocephalic.     Right Ear: External ear normal.     Left Ear: External ear normal.     Nose: Nose normal.     Mouth/Throat:     Mouth: Mucous membranes are moist.     Pharynx: Oropharynx is clear.  Eyes:     General: Lids are normal. Lids are  everted, no foreign bodies appreciated. Visual field deficit present.        Right eye: No foreign body or discharge.        Left eye: No foreign body or discharge.     Extraocular Movements: Extraocular movements intact.     Conjunctiva/sclera: Conjunctivae normal.     Right eye: Right conjunctiva is not injected.     Left eye: Left conjunctiva is not injected.     Comments: Vision changes, loss of vision 3 min at  a time 3 episodes today.  Cardiovascular:     Rate and Rhythm: Normal rate and regular rhythm.     Pulses: Normal pulses.     Heart sounds: Normal heart sounds.  Pulmonary:     Effort: Pulmonary effort is normal.     Breath sounds: Normal breath sounds.  Abdominal:     General: Bowel sounds are normal.  Musculoskeletal:        General: Normal range of motion.  Skin:    General: Skin is warm.  Neurological:     General: No focal deficit present.     Mental Status: He is alert and oriented to person, place, and time.  Psychiatric:  Mood and Affect: Mood normal.        Behavior: Behavior normal.     No results found for any visits on 07/13/21.      Assessment & Plan:   Problem List Items Addressed This Visit       Cardiovascular and Mediastinum   Amaurosis fugax, left eye - Primary    Symptoms of vision loss is not getting better, patient was cleared by opthalmology and the ED. Patient was told nothing is wrong with his eyes. Patient is following up today due to recurring incidence of visual loss for 3 minutes at a time and occurred twice today. Patient is concerned and demands further testing. A carotid MRI has been ordered to rule out blood clot and plaque that may affect patients vision.  Patient is also reporting night sweats, bilateral thigh tingling, sweating and ache.  He dinies pain in his eye and any other neurological symptoms  Patient knows to go to the ED with unresolved or worsening symptoms      Relevant Orders   MR NECK WO CONTRAST     No orders of the defined types were placed in this encounter.   Return if symptoms worsen or fail to improve.  Ivy Lynn, NP

## 2021-07-13 NOTE — Assessment & Plan Note (Signed)
Symptoms of vision loss is not getting better, patient was cleared by opthalmology and the ED. Patient was told nothing is wrong with his eyes. Patient is following up today due to recurring incidence of visual loss for 3 minutes at a time and occurred twice today. Patient is concerned and demands further testing. A carotid MRI has been ordered to rule out blood clot and plaque that may affect patients vision.  Patient is also reporting night sweats, bilateral thigh tingling, sweating and ache.  He dinies pain in his eye and any other neurological symptoms  Patient knows to go to the ED with unresolved or worsening symptoms

## 2021-07-13 NOTE — Patient Instructions (Signed)
Scotoma  Scotoma is an area of your vision where you cannot see as well or cannot see at all. This is sometimes called a blind spot. It may look like a dark or gray area where you cannot see anything, and it may be surrounded by a ring or an area of blurry vision. There are two types of scotoma: Central scotoma. This is when you have a blind spot in the middle of your vision. Peripheral scotoma. This is when you have a blind spot at the edge of your vision. There are many possible causes of scotoma. You may need to see a doctor who specializes in eye conditions (ophthalmologist) to find the cause. Follow these instructions at home: Take over-the-counter and prescription medicines only as told by your doctor. Do not drive or use machines unless your doctor says it is okay. Work with a vision therapist as told by your doctor. When you are reading or doing other activities that use small objects or involve small text, use good lighting and magnifying devices. Do not smoke or use any products that contain nicotine or tobacco. If you need help quitting, ask your doctor. Watch for any changes in your symptoms. Tell your doctor about them or any new symptoms. Keep all follow-up visits. Contact a doctor if: You have changes in your condition. Your symptoms get worse. You have eye pain. Get help right away if:  You have sudden loss of vision. You have any signs of a stroke. "BE FAST" is an easy way to remember the main warning signs: B - Balance. Dizziness, sudden trouble walking, or loss of balance. E - Eyes. Trouble seeing or a change in how you see. F - Face. Sudden weakness or loss of feeling of the face. The face or eyelid may droop on one side. A - Arms. Weakness or loss of feeling in an arm. This happens all of a sudden and most often on one side of the body. S - Speech. Sudden trouble speaking, slurred speech, or trouble understanding what people say. T - Time. Time to call emergency  services. Write down what time symptoms started. You have other signs of a stroke, such as: A sudden, very bad headache with no known cause. Feeling like you may vomit (nausea). Vomiting. A seizure. These symptoms may be an emergency. Get help right away. Call your local emergency services (911 in the [place] Do not wait to see if the symptoms will go away. Do not drive yourself to the hospital. Summary Scotoma is an area of your vision where you cannot see as well or cannot see at all. This is sometimes called a blind spot. When you are reading or doing other activities that use small objects or involve small text, use good lighting and magnifying devices. Keep all follow-up visits. This information is not intended to replace advice given to you by your health care provider. Make sure you discuss any questions you have with your health care provider. Document Revised: 03/26/2020 Document Reviewed: 03/26/2020 Elsevier Patient Education  2023 [employer]  

## 2021-07-16 ENCOUNTER — Telehealth: Payer: Self-pay | Admitting: Family Medicine

## 2021-07-16 ENCOUNTER — Other Ambulatory Visit: Payer: Self-pay

## 2021-07-16 ENCOUNTER — Emergency Department (HOSPITAL_COMMUNITY)
Admission: EM | Admit: 2021-07-16 | Discharge: 2021-07-16 | Disposition: A | Discharge status: Home / Self Care | Payer: Medicare HMO | Attending: Emergency Medicine | Admitting: Emergency Medicine

## 2021-07-16 ENCOUNTER — Encounter (HOSPITAL_COMMUNITY): Payer: Self-pay | Admitting: *Deleted

## 2021-07-16 ENCOUNTER — Emergency Department (HOSPITAL_COMMUNITY): Payer: Medicare HMO

## 2021-07-16 DIAGNOSIS — Z87891 Personal history of nicotine dependence: Secondary | ICD-10-CM | POA: Diagnosis not present

## 2021-07-16 DIAGNOSIS — G459 Transient cerebral ischemic attack, unspecified: Secondary | ICD-10-CM | POA: Diagnosis not present

## 2021-07-16 DIAGNOSIS — H5460 Unqualified visual loss, one eye, unspecified: Secondary | ICD-10-CM | POA: Diagnosis not present

## 2021-07-16 DIAGNOSIS — G453 Amaurosis fugax: Secondary | ICD-10-CM | POA: Diagnosis not present

## 2021-07-16 DIAGNOSIS — H53122 Transient visual loss, left eye: Secondary | ICD-10-CM | POA: Diagnosis not present

## 2021-07-16 DIAGNOSIS — H5462 Unqualified visual loss, left eye, normal vision right eye: Secondary | ICD-10-CM

## 2021-07-16 LAB — CBC WITH DIFFERENTIAL/PLATELET
Abs Immature Granulocytes: 0.03 10*3/uL (ref 0.00–0.07)
Basophils Absolute: 0 10*3/uL (ref 0.0–0.1)
Basophils Relative: 0 %
Eosinophils Absolute: 0.3 10*3/uL (ref 0.0–0.5)
Eosinophils Relative: 3 %
HCT: 42.3 % (ref 39.0–52.0)
Hemoglobin: 13.6 g/dL (ref 13.0–17.0)
Immature Granulocytes: 0 %
Lymphocytes Relative: 19 %
Lymphs Abs: 1.8 10*3/uL (ref 0.7–4.0)
MCH: 27.3 pg (ref 26.0–34.0)
MCHC: 32.2 g/dL (ref 30.0–36.0)
MCV: 84.9 fL (ref 80.0–100.0)
Monocytes Absolute: 0.6 10*3/uL (ref 0.1–1.0)
Monocytes Relative: 6 %
Neutro Abs: 7 10*3/uL (ref 1.7–7.7)
Neutrophils Relative %: 72 %
Platelets: 501 10*3/uL — ABNORMAL HIGH (ref 150–400)
RBC: 4.98 MIL/uL (ref 4.22–5.81)
RDW: 12.3 % (ref 11.5–15.5)
WBC: 9.9 10*3/uL (ref 4.0–10.5)
nRBC: 0 % (ref 0.0–0.2)

## 2021-07-16 LAB — BASIC METABOLIC PANEL
Anion gap: 8 (ref 5–15)
BUN: 13 mg/dL (ref 8–23)
CO2: 27 mmol/L (ref 22–32)
Calcium: 8.9 mg/dL (ref 8.9–10.3)
Chloride: 101 mmol/L (ref 98–111)
Creatinine, Ser: 0.88 mg/dL (ref 0.61–1.24)
GFR, Estimated: >60 mL/min (ref >60)
Glucose, Bld: 109 mg/dL — ABNORMAL HIGH (ref 70–99)
Potassium: 4.1 mmol/L (ref 3.5–5.1)
Sodium: 136 mmol/L (ref 135–145)

## 2021-07-16 LAB — LIPID PANEL
Cholesterol: 196 mg/dL (ref 0–200)
HDL: 42 mg/dL (ref >40)
LDL Cholesterol: 138 mg/dL — ABNORMAL HIGH (ref 0–99)
Total CHOL/HDL Ratio: 4.7 RATIO
Triglycerides: 80 mg/dL (ref <150)
VLDL: 16 mg/dL (ref 0–40)

## 2021-07-16 LAB — SEDIMENTATION RATE: Sed Rate: 82 mm/hr — ABNORMAL HIGH (ref 0–16)

## 2021-07-16 LAB — C-REACTIVE PROTEIN: CRP: 14.9 mg/dL — ABNORMAL HIGH (ref <1.0)

## 2021-07-16 IMAGING — MR MR HEAD WO/W CM
16 of 17 series · 39 of 48 positions shown · IV contrast (gadavist)
Comparison: CT angio head neck [DATE]

CLINICAL DATA: TIA.  Vision change

EXAM:
MRI HEAD AND ORBITS WITHOUT AND WITH CONTRAST
TECHNIQUE: Multiplanar, multiecho pulse sequences of the brain and surrounding
structures were obtained without and with intravenous contrast.
Multiplanar, multiecho pulse sequences of the orbits and surrounding
structures were obtained including fat saturation techniques, before
and after intravenous contrast administration.
CONTRAST:  7mL GADAVIST GADOBUTROL 1 MMOL/ML IV SOLN

[Series 5: DWI · axial · 3.0mm · 0.77mm/px · z∈[-62,+82]mm · 4 of 50 slices shown (1 of 6)]
[im 1/50]
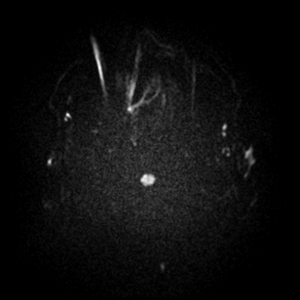
[im 17/50]
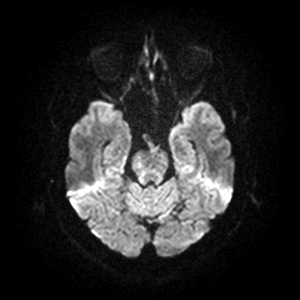
[im 33/50]
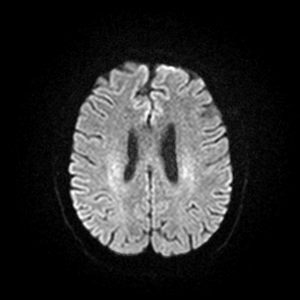
[im 50/50]
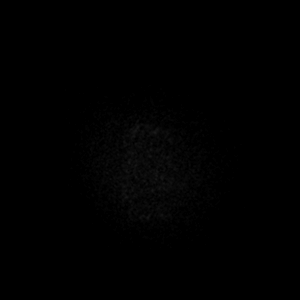

[Series 5: DWI · axial · 3.0mm · 0.77mm/px · z∈[-62,+82]mm · 3 of 50 slices shown (2 of 6)]
[im 1/50]
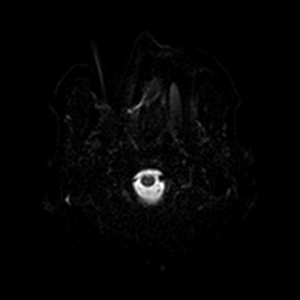
[im 25/50]
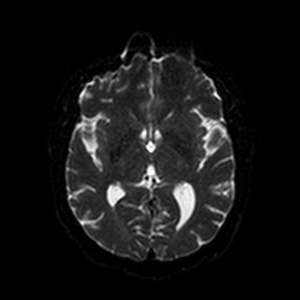
[im 50/50]
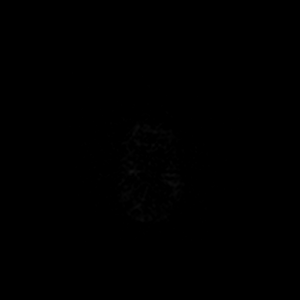

[Series 6: DWI · axial · 3.0mm · 0.77mm/px · z∈[-62,+82]mm · 3 of 50 slices shown (3 of 6)]
[im 1/50]
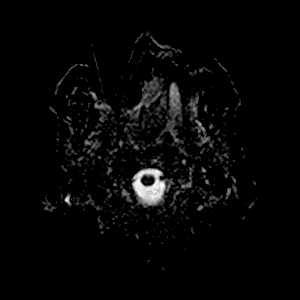
[im 25/50]
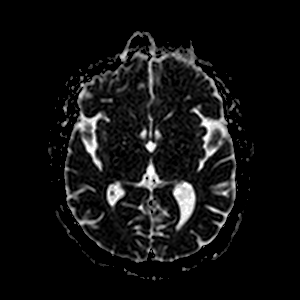
[im 50/50]
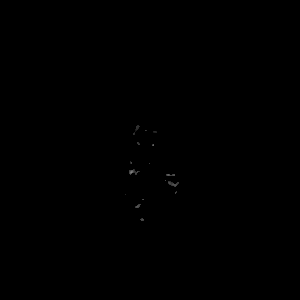

[Series 7: DWI · coronal · 5.0mm · 0.88mm/px · 1 of 28 slices shown (4 of 6)]
[im 1/28]
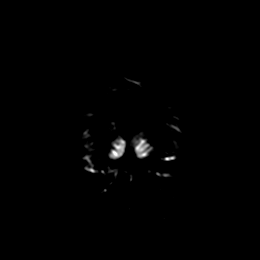

[Series 7: DWI · coronal · 5.0mm · 0.88mm/px · 1 of 28 slices shown (5 of 6)]
[im 1/28]
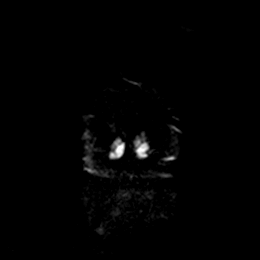

[Series 8: DWI · coronal · 5.0mm · 0.88mm/px · 1 of 28 slices shown (6 of 6)]
[im 1/28]
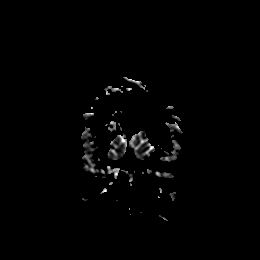

[Series 9: T1 · sagittal · 5.0mm · 0.75mm/px · 1 of 21 slices shown (1 of 2)]
[im 1/21]
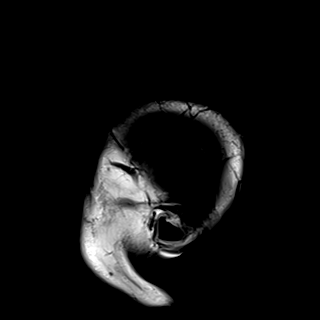

[Series 10: T2 · axial · 5.0mm · 0.72mm/px · 1 of 23 slices shown (1 of 2)]
[im 1/23]
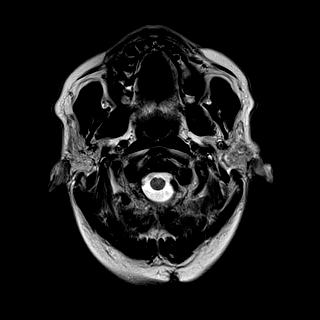

[Series 11: mag_images · axial · 3.0mm · 0.90mm/px · z∈[-76,+98]mm · 3 of 60 slices shown]
[im 1/60]
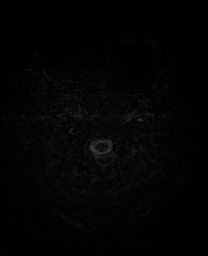
[im 30/60]
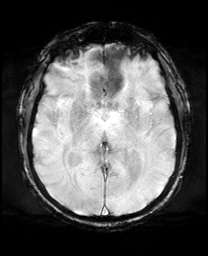
[im 60/60]
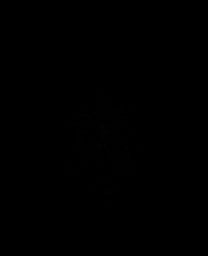

[Series 12: pha_images · axial · 3.0mm · 0.90mm/px · z∈[-76,+92]mm · 3 of 57 slices shown]
[im 1/57]
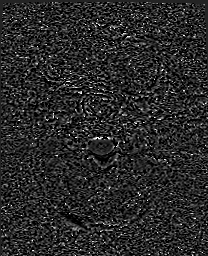
[im 29/57]
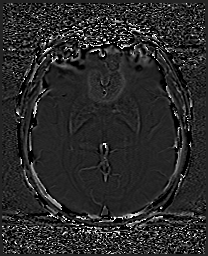
[im 57/57]
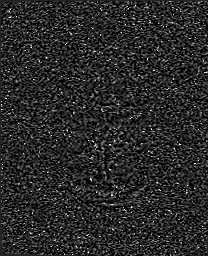

[Series 13: swi_images · axial · 3.0mm · 0.90mm/px · z∈[-76,+98]mm · 3 of 60 slices shown]
[im 1/60]
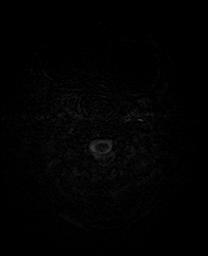
[im 30/60]
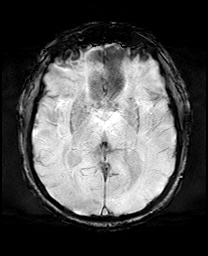
[im 60/60]
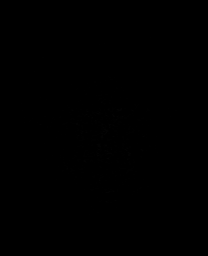

[Series 15: FLAIR · axial · 3.0mm · 0.45mm/px · z∈[-61,+83]mm · 3 of 50 slices shown]
[im 1/50]
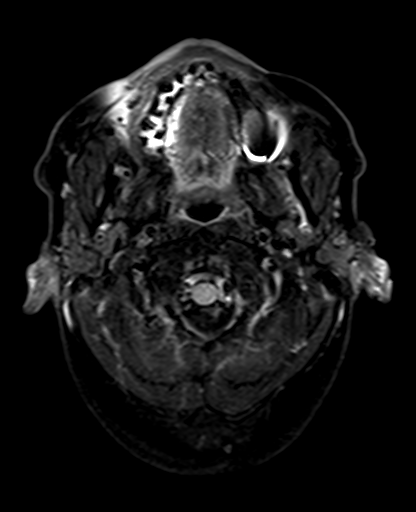
[im 25/50]
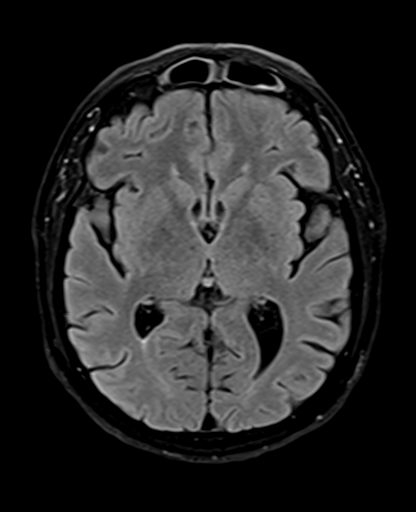
[im 50/50]
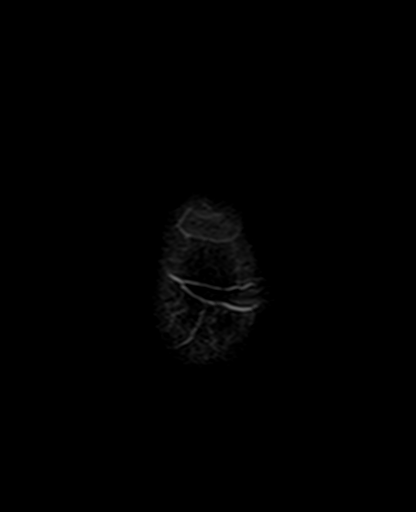

[Series 16: T2 · coronal · 5.0mm · 0.72mm/px · 1 of 28 slices shown (2 of 2)]
[im 1/28]
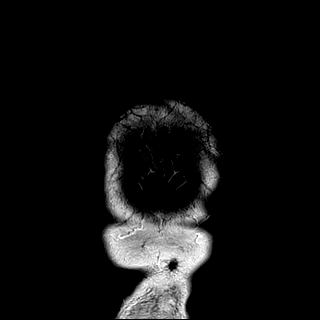

[Series 18: T1 post-contrast · coronal · 5.0mm · 0.34mm/px · 1 of 28 slices shown (1 of 2)]
[im 1/28]
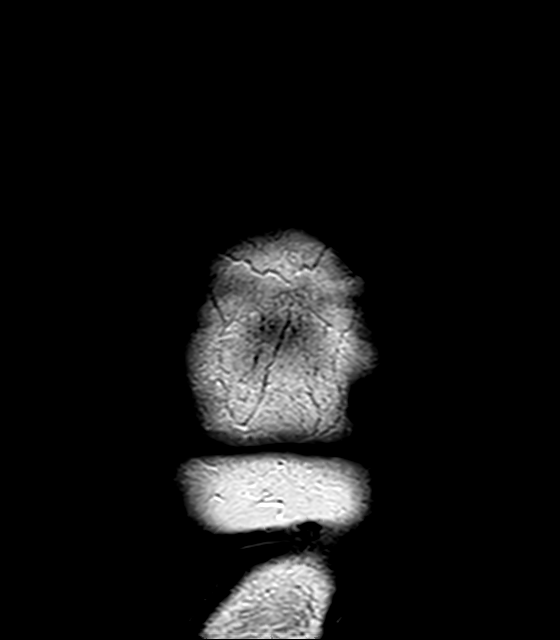

[Series 19: T1 post-contrast · sagittal · 5.0mm · 0.75mm/px · 1 of 21 slices shown (2 of 2)]
[im 1/21]
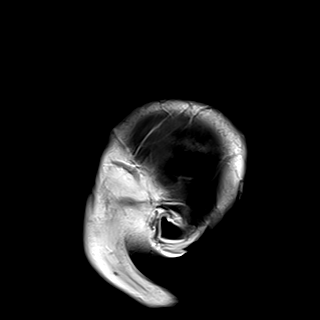

[Series 100: T1 · axial · 1.0mm · 0.98mm/px · z∈[-74,+98]mm · 9 of 174 slices shown (2 of 2)]
[im 1/174]
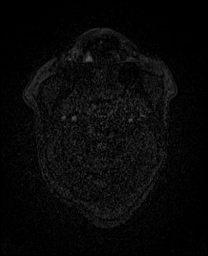
[im 22/174]
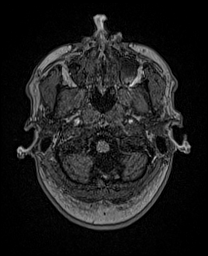
[im 44/174]
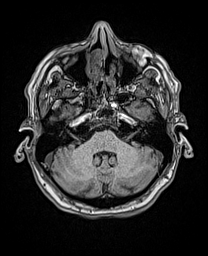
[im 65/174]
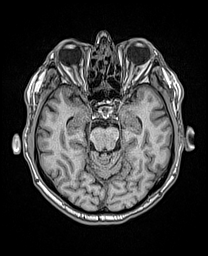
[im 87/174]
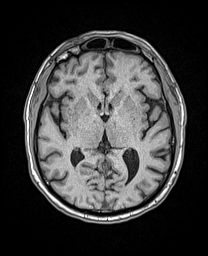
[im 109/174]
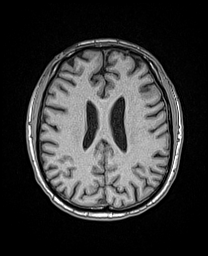
[im 130/174]
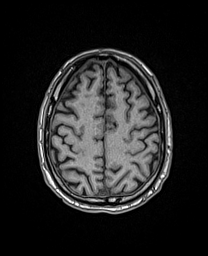
[im 152/174]
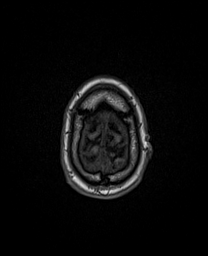
[im 174/174]
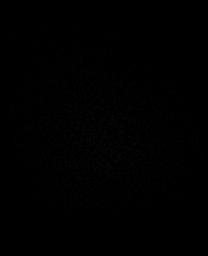

[39 of 48 positions shown; findings below may reference images not displayed]

FINDINGS: MRI HEAD FINDINGS

Brain: Negative for acute infarct. No significant chronic ischemia.
Ventricle size normal. Negative for hemorrhage or mass.

Normal enhancement.

Vascular: Normal arterial flow voids

Skull and upper cervical spine: Negative

Other: None

MRI ORBITS FINDINGS

Orbits: No orbital mass. Normal orbital fat. No orbital edema.
Extraocular muscles normal. Optic nerve normal in signal and size
bilaterally. No enhancing mass lesion.

Visualized sinuses: Moderate mucosal edema throughout the paranasal
sinuses. Air-fluid level sphenoid sinus.

Soft tissues: Negative for soft tissue swelling
IMPRESSION: 1. Negative MRI of the head
2. Negative MRI of the orbits
3. Moderate mucosal edema in the paranasal sinuses. Air-fluid level
sphenoid sinus.

## 2021-07-16 IMAGING — MR MR ORBITS WO/W CM
4 of 6 series · 30 of 48 positions shown · IV contrast (gadavist)
Comparison: CT angio head neck [DATE]

CLINICAL DATA: TIA.  Vision change

EXAM:
MRI HEAD AND ORBITS WITHOUT AND WITH CONTRAST
TECHNIQUE: Multiplanar, multiecho pulse sequences of the brain and surrounding
structures were obtained without and with intravenous contrast.
Multiplanar, multiecho pulse sequences of the orbits and surrounding
structures were obtained including fat saturation techniques, before
and after intravenous contrast administration.
CONTRAST:  7mL GADAVIST GADOBUTROL 1 MMOL/ML IV SOLN

[Series 3: T2 · axial · 3.0mm · 0.78mm/px · z∈[-33,+29]mm · 6 of 17 slices shown (1 of 2)]
[im 1/17]
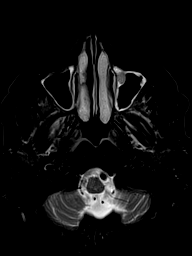
[im 4/17]
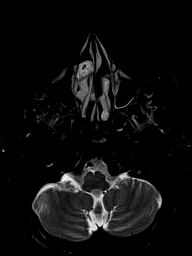
[im 7/17]
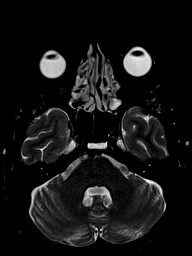
[im 10/17]
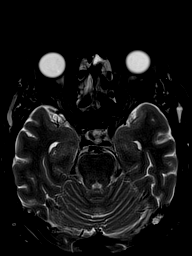
[im 13/17]
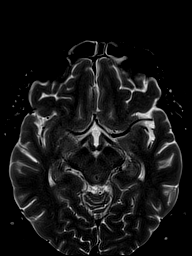
[im 17/17]
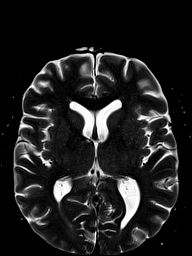

[Series 5: T2 · coronal · 3.0mm · 0.78mm/px · 8 of 22 slices shown (2 of 2)]
[im 1/22]
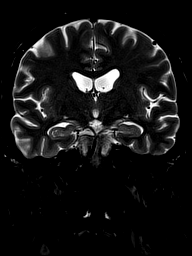
[im 4/22]
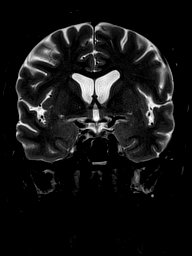
[im 7/22]
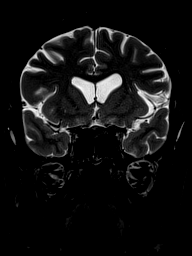
[im 10/22]
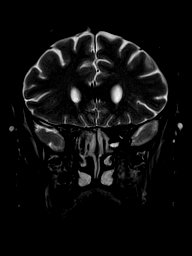
[im 13/22]
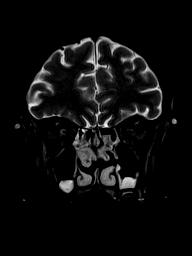
[im 16/22]
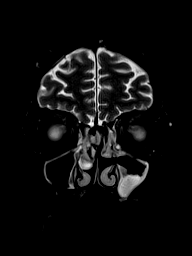
[im 19/22]
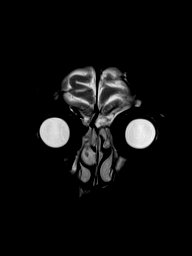
[im 22/22]
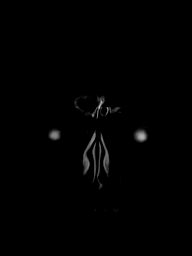

[Series 6: T1 · axial · non-contrast · 3.0mm · 0.31mm/px · z∈[-39,+25]mm · 8 of 23 slices shown (1 of 2)]
[im 1/23]
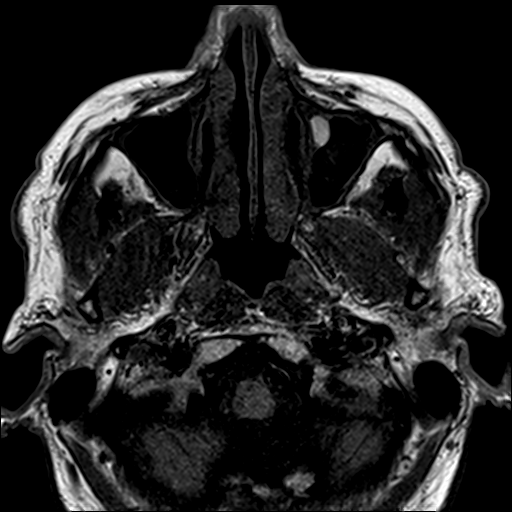
[im 4/23]
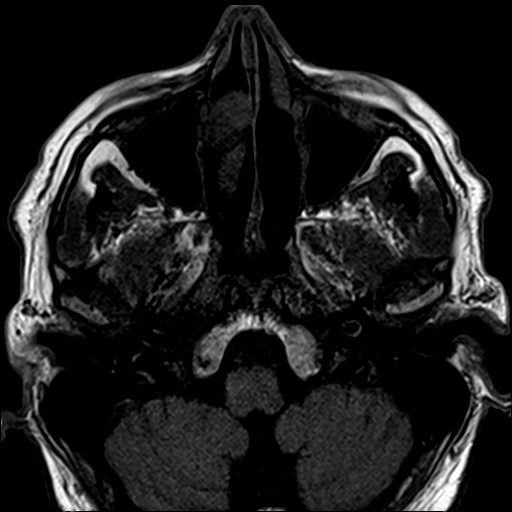
[im 7/23]
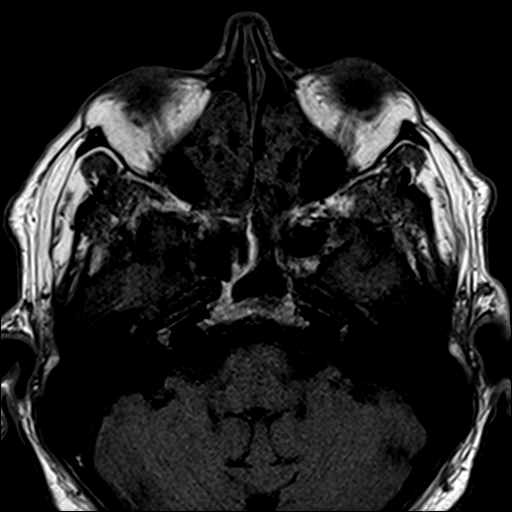
[im 10/23]
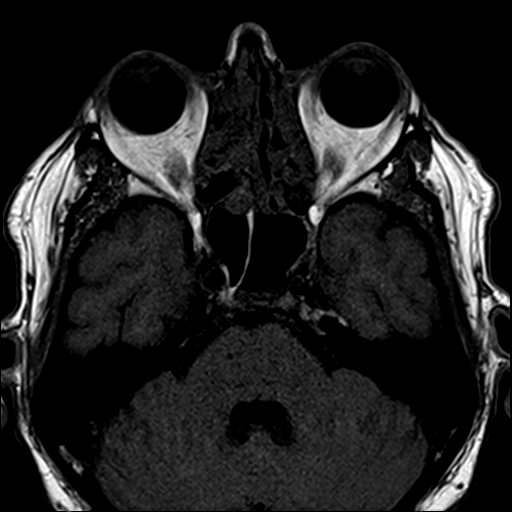
[im 13/23]
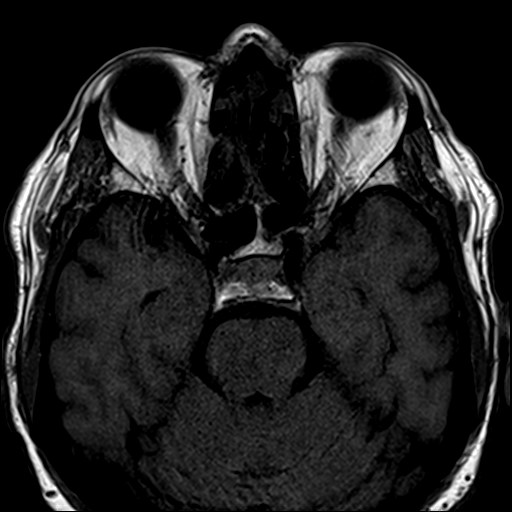
[im 16/23]
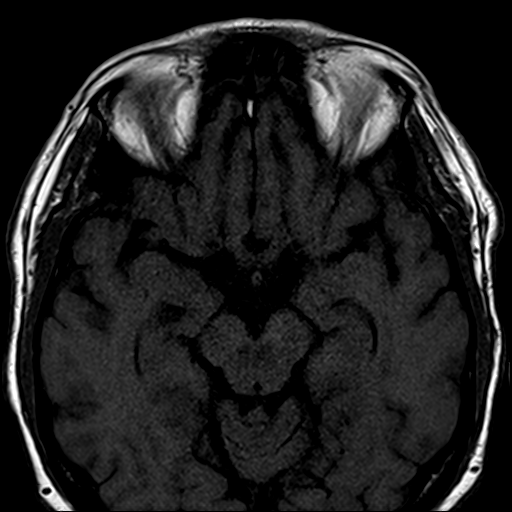
[im 19/23]
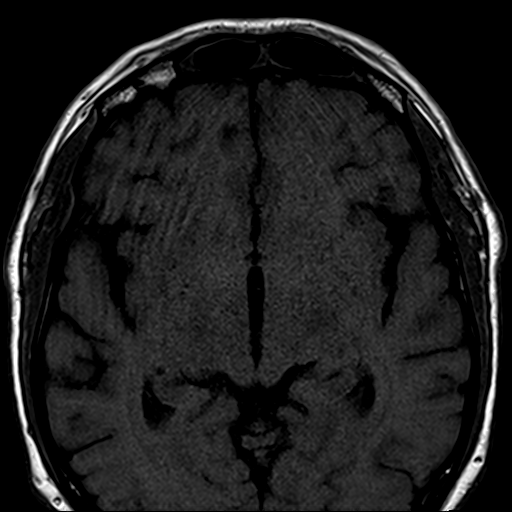
[im 23/23]
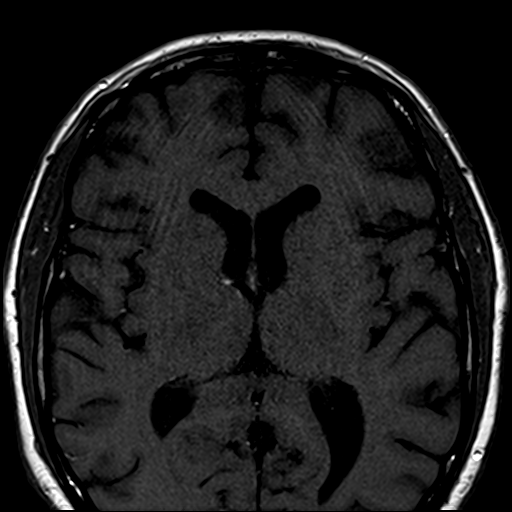

[Series 7: T1 · coronal · non-contrast · 3.0mm · 0.35mm/px · 8 of 24 slices shown (2 of 2)]
[im 1/24]
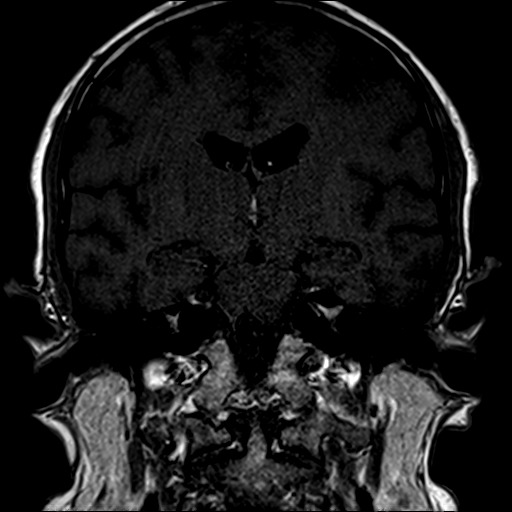
[im 3/24]
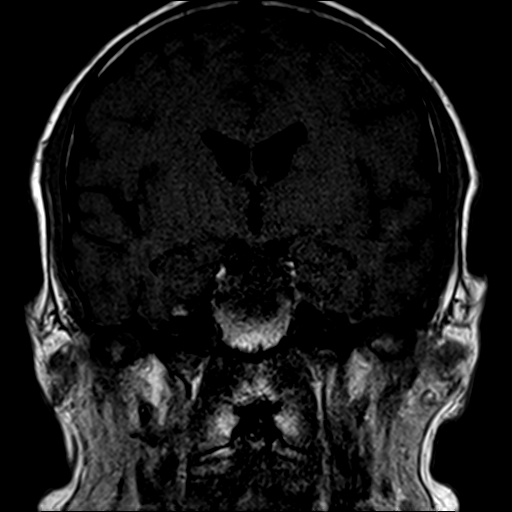
[im 6/24]
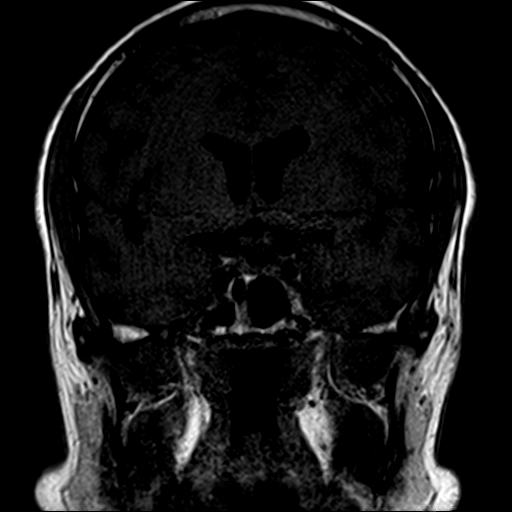
[im 9/24]
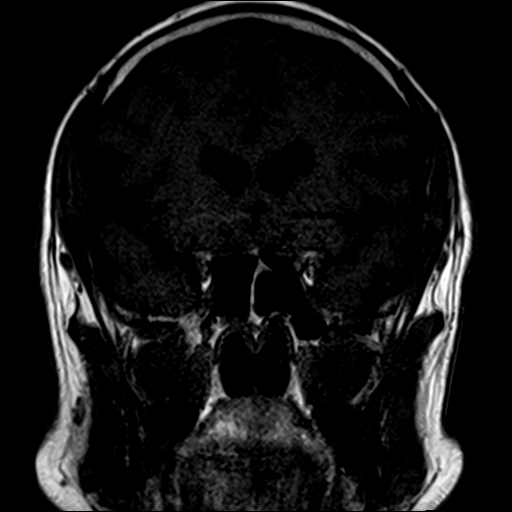
[im 12/24]
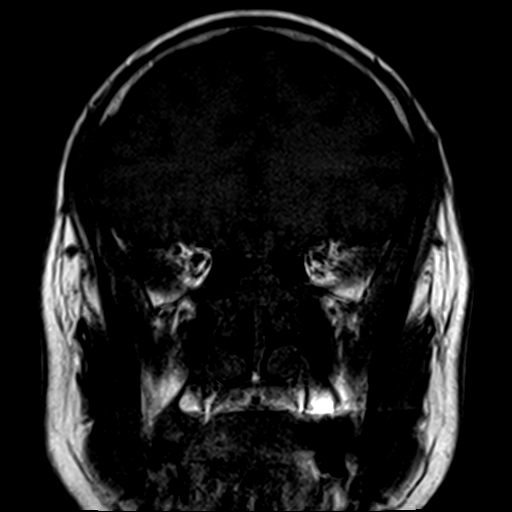
[im 15/24]
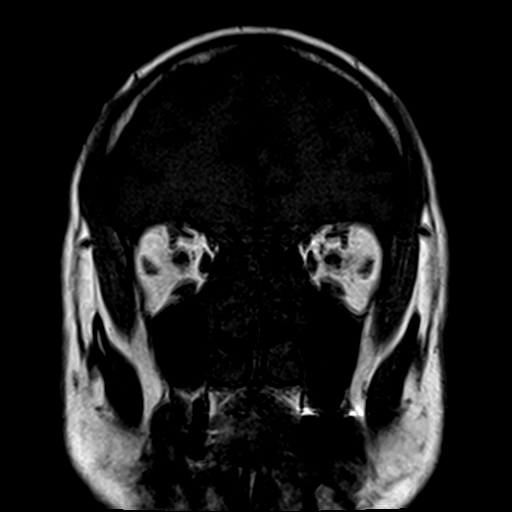
[im 18/24]
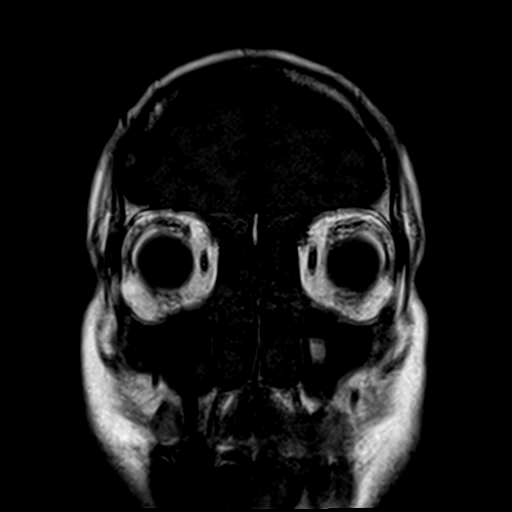
[im 21/24]
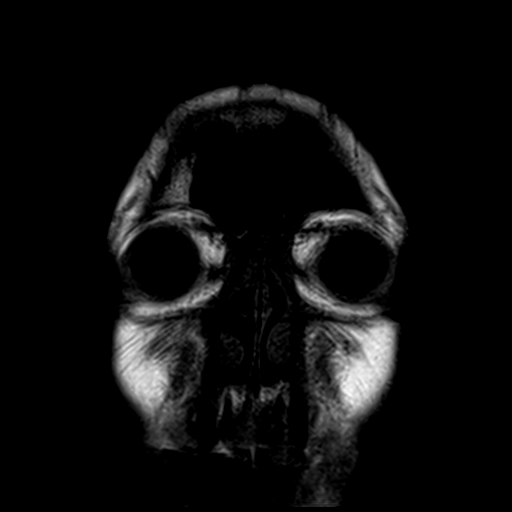

[30 of 48 positions shown; findings below may reference images not displayed]

FINDINGS: MRI HEAD FINDINGS

Brain: Negative for acute infarct. No significant chronic ischemia.
Ventricle size normal. Negative for hemorrhage or mass.

Normal enhancement.

Vascular: Normal arterial flow voids

Skull and upper cervical spine: Negative

Other: None

MRI ORBITS FINDINGS

Orbits: No orbital mass. Normal orbital fat. No orbital edema.
Extraocular muscles normal. Optic nerve normal in signal and size
bilaterally. No enhancing mass lesion.

Visualized sinuses: Moderate mucosal edema throughout the paranasal
sinuses. Air-fluid level sphenoid sinus.

Soft tissues: Negative for soft tissue swelling
IMPRESSION: 1. Negative MRI of the head
2. Negative MRI of the orbits
3. Moderate mucosal edema in the paranasal sinuses. Air-fluid level
sphenoid sinus.

## 2021-07-16 MED ORDER — GADOBUTROL 1 MMOL/ML IV SOLN
7.0000 mL | Freq: Once | INTRAVENOUS | Status: AC | PRN
  Administered 2021-07-16: 7 mL via INTRAVENOUS

## 2021-07-16 MED ORDER — TETRACAINE HCL 0.5 % OP SOLN
2.0000 [drp] | Freq: Once | OPHTHALMIC | Status: AC
  Administered 2021-07-16: 2 [drp] via OPHTHALMIC
  Filled 2021-07-16: qty 4

## 2021-07-16 NOTE — ED Provider Notes (Cosign Needed Addendum)
[facility] EMERGENCY DEPARTMENT Provider Note   CSN: [id number] Arrival date & time: 07/16/21  1253     History  Chief Complaint  Patient presents with   Loss of Vision    Michael Joseph is a 71 y.o. male returning to the ED today due to continuation of his transient vision loss episodes.  Was seen in the ED few days ago, was encouraged to be admitted for TIA work-up, patient declined at this time and opted to follow-up with PCP.  Per patient, PCP recommended continuation of work-up.  Today he wishes to pick up where he left off.  Has had between 1-3 more episodes of vision loss since discharge from the ED. states 1 of these episodes occurred right before walking into the ED, when he bent down to pick something up stood up and noticed gradually decreasing vision that went pitch black.  Always the left eye.  Lasted 3 minutes like the others, and then resolved.  Denies any other changes, unilateral weakness, numbness, facial asymmetry, focal deficits.  The history is provided by the patient and medical records.       Home Medications Prior to Admission medications   Medication Sig Start Date End Date Taking? Authorizing Provider  albuterol (PROAIR HFA) 108 (90 Base) MCG/ACT inhaler Inhale 2 puffs into the lungs every 6 (six) hours as needed for wheezing or shortness of breath. 30-45 min prior to exercise Need to be seen 10/10/19   Claretta Fraise, MD  fexofenadine-pseudoephedrine (ALLEGRA-D 24) 180-240 MG 24 hr tablet Take 1 tablet by mouth every evening. For allergy and congestion 02/04/21   Claretta Fraise, MD  finasteride (PROSCAR) 5 MG tablet TAKE 1 TABLET DAILY 07/06/21   Claretta Fraise, MD  fluticasone [facility]) 50 MCG/ACT nasal spray Place 2 sprays into both nostrils daily. 10/10/19   Claretta Fraise, MD  tamsulosin (FLOMAX) 0.4 MG CAPS capsule TAKE 2 CAPSULES AT BEDTIME FOR URINE FLOW AND PROSTATE 07/06/21   Claretta Fraise, MD      Allergies    Amoxicillin    Review of Systems    Review of Systems  Eyes:        Intermittent vision loss    Physical Exam Updated Vital Signs BP 131/69   Pulse 82   Temp 98.6 F (37 C) (Oral)   Resp 18   Ht '5\' 8"'  (1.727 m)   Wt 74.8 kg   SpO2 99%   BMI 25.09 kg/m  Physical Exam Vitals and nursing note reviewed.  Constitutional:      General: He is not in acute distress.    Appearance: Normal appearance. He is well-developed. He is not ill-appearing or diaphoretic.  HENT:     Head: Normocephalic and atraumatic.  Eyes:     General: Lids are normal. Vision grossly intact. Gaze aligned appropriately. No visual field deficit or scleral icterus.       Right eye: No discharge.        Left eye: No discharge.     Intraocular pressure: Right eye pressure is 17 mmHg. Left eye pressure is 14 mmHg. Measurements were taken using a handheld tonometer.    Extraocular Movements: Extraocular movements intact.     Right eye: No nystagmus.     Left eye: No nystagmus.     Conjunctiva/sclera: Conjunctivae normal.     Right eye: Right conjunctiva is not injected. No hemorrhage.    Left eye: Left conjunctiva is not injected. No hemorrhage.    Pupils: Pupils are equal,  round, and reactive to light.     Visual Fields: Right eye visual fields normal and left eye visual fields normal.  Cardiovascular:     Rate and Rhythm: Normal rate and regular rhythm.     Pulses: Normal pulses.     Heart sounds: No murmur heard. Pulmonary:     Effort: Pulmonary effort is normal. No respiratory distress.     Breath sounds: Normal breath sounds. No wheezing.  Chest:     Chest wall: No tenderness.  Abdominal:     Palpations: Abdomen is soft.     Tenderness: There is no abdominal tenderness.  Musculoskeletal:        General: No swelling.     Cervical back: Neck supple. No rigidity or tenderness.     Right lower leg: No edema.     Left lower leg: No edema.  Skin:    General: Skin is warm and dry.     Capillary Refill: Capillary refill takes less than 2  seconds.     Coloration: Skin is not pale.  Neurological:     General: No focal deficit present.     Mental Status: He is alert and oriented to person, place, and time.     GCS: GCS eye subscore is 4. GCS verbal subscore is 5. GCS motor subscore is 6.     Cranial Nerves: No dysarthria or facial asymmetry.     Sensory: No sensory deficit.     Motor: No weakness, tremor, seizure activity or pronator drift.     Coordination: Coordination normal. Finger-Nose-Finger Test and Heel to [facility] Test normal.     Gait: Gait normal.     Comments: Normal EOMs.  No facial asymmetry, dysarthria, abnormal gait, normal coordination, tremor, pronator drift.  Psychiatric:        Mood and Affect: Mood normal.     ED Results / Procedures / Treatments   Labs (all labs ordered are listed, but only abnormal results are displayed) Labs Reviewed  BASIC METABOLIC PANEL - Abnormal; Notable for the following components:      Result Value   Glucose, Bld 109 (*)    All other components within normal limits  CBC WITH DIFFERENTIAL/PLATELET - Abnormal; Notable for the following components:   Platelets 501 (*)    All other components within normal limits  LIPID PANEL - Abnormal; Notable for the following components:   LDL Cholesterol 138 (*)    All other components within normal limits  SEDIMENTATION RATE - Abnormal; Notable for the following components:   Sed Rate 82 (*)    All other components within normal limits  C-REACTIVE PROTEIN    EKG None  Radiology MR Brain W and Wo Contrast  Result Date: 07/16/2021 CLINICAL DATA:  TIA.  Vision change EXAM: MRI HEAD AND ORBITS WITHOUT AND WITH CONTRAST TECHNIQUE: Multiplanar, multiecho pulse sequences of the brain and surrounding structures were obtained without and with intravenous contrast. Multiplanar, multiecho pulse sequences of the orbits and surrounding structures were obtained including fat saturation techniques, before and after intravenous contrast  administration. CONTRAST:  23m GADAVIST GADOBUTROL 1 MMOL/ML IV SOLN COMPARISON:  CT angio head neck 07/12/2021 FINDINGS: MRI HEAD FINDINGS Brain: Negative for acute infarct. No significant chronic ischemia. Ventricle size normal. Negative for hemorrhage or mass. Normal enhancement. Vascular: Normal arterial flow voids Skull and upper cervical spine: Negative Other: None MRI ORBITS FINDINGS Orbits: No orbital mass. Normal orbital fat. No orbital edema. Extraocular muscles normal. Optic nerve normal in signal  and size bilaterally. No enhancing mass lesion. Visualized sinuses: Moderate mucosal edema throughout the paranasal sinuses. Air-fluid level sphenoid sinus. Soft tissues: Negative for soft tissue swelling IMPRESSION: 1. Negative MRI of the head 2. Negative MRI of the orbits 3. Moderate mucosal edema in the paranasal sinuses. Air-fluid level sphenoid sinus. Electronically Signed   By: Franchot Gallo M.D.   On: 07/16/2021 17:43   MR ORBITS W WO CONTRAST  Result Date: 07/16/2021 CLINICAL DATA:  TIA.  Vision change EXAM: MRI HEAD AND ORBITS WITHOUT AND WITH CONTRAST TECHNIQUE: Multiplanar, multiecho pulse sequences of the brain and surrounding structures were obtained without and with intravenous contrast. Multiplanar, multiecho pulse sequences of the orbits and surrounding structures were obtained including fat saturation techniques, before and after intravenous contrast administration. CONTRAST:  49m GADAVIST GADOBUTROL 1 MMOL/ML IV SOLN COMPARISON:  CT angio head neck 07/12/2021 FINDINGS: MRI HEAD FINDINGS Brain: Negative for acute infarct. No significant chronic ischemia. Ventricle size normal. Negative for hemorrhage or mass. Normal enhancement. Vascular: Normal arterial flow voids Skull and upper cervical spine: Negative Other: None MRI ORBITS FINDINGS Orbits: No orbital mass. Normal orbital fat. No orbital edema. Extraocular muscles normal. Optic nerve normal in signal and size bilaterally. No enhancing  mass lesion. Visualized sinuses: Moderate mucosal edema throughout the paranasal sinuses. Air-fluid level sphenoid sinus. Soft tissues: Negative for soft tissue swelling IMPRESSION: 1. Negative MRI of the head 2. Negative MRI of the orbits 3. Moderate mucosal edema in the paranasal sinuses. Air-fluid level sphenoid sinus. Electronically Signed   By: CFranchot GalloM.D.   On: 07/16/2021 17:43    Procedures Procedures    Medications Ordered in ED Medications  gadobutrol (GADAVIST) 1 MMOL/ML injection 7 mL (7 mLs Intravenous Contrast Given 07/16/21 1708)  tetracaine (PONTOCAINE) 0.5 % ophthalmic solution 2 drop (2 drops Left Eye Given 07/16/21 1838)    ED Course/ Medical Decision Making/ A&P                           Medical Decision Making Amount and/or Complexity of Data Reviewed External Data Reviewed: notes. Labs: ordered. Decision-making details documented in ED Course. Radiology: ordered and independent interpretation performed. Decision-making details documented in ED Course. ECG/medicine tests: ordered and independent interpretation performed. Decision-making details documented in ED Course.  Risk OTC drugs. Prescription drug management.   71y.o. male presents to the ED for concern of Loss of Vision     This involves an extensive number of treatment options, and is a complaint that carries with it a high risk of complications and morbidity.  The emergent differential diagnosis prior to evaluation includes, but is not limited to: TIA, optic neuritis, retinal occlusion, retinal detachment  This is not an exhaustive differential.   Past Medical History / Co-morbidities / Social History: Hx of OSA, obesity, colonic polyps, seasonal allergies, former tobacco use  Additional History:  Internal and external records from outside source obtained and reviewed including ED visits, Primary care  Physical Exam: Physical exam performed. The pertinent findings include: Unremarkable  exam  Lab Tests: I ordered, and personally interpreted labs.  The pertinent results include:   CBC: Unremarkable CMP/BMP: Unremarkable Lipid panel: LDL 138 ESR: 82, elevated CRP: 14.9, elevated  Imaging Studies: I ordered imaging studies including MRI of brain and orbits.  I independently visualized and interpreted said imaging.  Pertinent results include: 1. Negative MRI of the head/orbits 2. Moderate mucosal edema in the paranasal sinuses I agree with  the radiologist interpretation.  ED Course: Pt well-appearing on exam.  Return to the ED for repeat vision changes, specifically transient vision losses of the left eye over the last 4 weeks.  This has occurred between 9-13 times per patient, who has been keeping a record.  Occurred again today walking into the ED from the parking lot, and again resolved within 3 minutes.  Without any neurodeficits.  No temporal tenderness.  EOMs and visual acuity normal.  PERRLA.  1555: I consulted with Dr. Leonel Ramsay of Neurology, and discussed lab and imaging findings as well as pertinent plan - they recommend: Proceeding with ESR, CRP, lipid panel with MRI imaging of the brain and orbits before considering admission.  Expresses lower concern of TIA due to the amount of transient vision loss episodes, as this is atypical of a TIA.  If results are nonconcerning, recommends follow-up with Opthoneurology, likely at [facility] or [facility].  If results are concerning, will address at that time.  Requests to revisit planning once tests have resulted.  1850: Upon reevaluation, patient states he notices a "presence" behind the left eye at baseline since initial symptom onset.  Denies this is a pain or pressure.  Eye pressures measured via tonometer are unremarkable.  Also admits to mild lower extremity "heaviness" that occurred and mainly resolved a few weeks ago.  Without any recent or current chest pain, shortness of breath, dizziness, or numbness.  On exam, lower  extremities appear neurovascularly intact without weakness, poor coordination, or tenderness.  No focal deficits appreciated as described above.  MRI imaging negative.  CRP still pending.    1945: I consulted again with Dr. Leonel Ramsay.  He expresses concern for possible atypical presentation of giant cell arteritis due to elevated ESR 82.  Emphasized that without temporal or orbital pain, and without active consistent symptoms, giant cell arteritis is less likely but advises further evaluation.  Does not believe a biopsy is warranted at this time.  Does not believe the lower extremity symptoms are related.  Recommends urgent follow up with ophthalmology academic center such as [facility] or [facility] within the next few days.  If patient unable to see specialist within that time or has worsening symptoms, urges prompt return to ED.    1950: I contacted [facility] and spoke with patient coordinator.  Per pt's permission provided contact information and requested an urgent follow up appointment.  Patient coordinator provided this information to oncall Ophthalmologist and pt will be contacted tomorrow morning regarding appointment confirmation.  Pt urged to return to the ED immediately should he be unable to follow up timely with ophthalmology center.  Pt and spouse in agreement.  Pt satisfied with today's encounter.  In NAD and good condition at time of discharge.  Disposition: After consideration of the diagnostic results and the patient's encounter today, I feel that the emergency department workup does not suggest an emergent condition requiring admission or immediate intervention beyond what has been performed at this time.  The patient is safe for discharge and has been instructed to return immediately for worsening symptoms, change in symptoms or any other concerns.  I have reviewed the patients home medicines and have made adjustments as needed.  Discussed course of treatment thoroughly with the  patient, whom demonstrated understanding.  No further questions.   I discussed this case with my attending, Dr. Melina Copa, who agreed with the proposed treatment course and cosigned this note including patient's presenting symptoms, physical exam, and planned diagnostics and interventions.  Attending physician stated agreement with plan or made changes to plan which were implemented.     This chart was dictated using voice recognition software.  Despite best efforts to proofread, errors can occur which can change the documentation meaning.         Final Clinical Impression(s) / ED Diagnoses Final diagnoses:  Amaurosis fugax, left eye  Unqualified visual loss, left eye, normal vision right eye    Rx / DC Orders ED Discharge Orders     None         Prince Rome, PA-C 96/92/49 [id number]    Prince Rome, PA-C 99/14/44 2345

## 2021-07-16 NOTE — ED Notes (Signed)
Patient transported to MRI 

## 2021-07-16 NOTE — Telephone Encounter (Signed)
REFERRAL REQUEST Telephone Note  Have you been seen at our office for this problem? YES (Advise that they may need an appointment with their PCP before a referral can be done)  Reason for Referral: Pt has episodes of moving vision and has been to ER for this  Referral discussed with patient: yes  Best contact number of patient for referral team: [phone]     Has patient been seen by a specialist for this issue before: no  Patient provider preference for referral: no Patient location preference for referral: no   Patient notified that referrals can take up to a week or longer to process. If they haven't heard anything within a week they should call back and speak with the referral department.

## 2021-07-16 NOTE — Discharge Instructions (Addendum)
I have spoken directly with [facility] and provided your contact information.  They will be calling you within the next 2 days to set up a urgent follow-up at one of the locations.  If you have not heard from them by that time, call this number [phone] to follow-up.  You also have the contact information for [facility] ophthalmology.  If you are having difficulty following up with [facility], please give them a call as well.  Follow-up with your PCP within the next few days for reevaluation continue medical management as well.  Return to the ED for new or worsening symptoms as discussed.

## 2021-07-16 NOTE — ED Triage Notes (Addendum)
Here from home for 9 separate episodes of vision loss, recently worked up for the same, triggered by change in [place], last ~3 minutes, seen here recently for the same, declined recent MRI. Seen by ophthalmology, "was cleared and instructed to check arteries". Denies eye pain, nausea, dizziness, or HA. Describes as L eye, can see borders, then black then borders then clear.

## 2021-07-20 ENCOUNTER — Telehealth: Payer: Self-pay | Admitting: Family Medicine

## 2021-07-20 ENCOUNTER — Other Ambulatory Visit: Payer: Self-pay | Admitting: Family Medicine

## 2021-07-20 DIAGNOSIS — H53122 Transient visual loss, left eye: Secondary | ICD-10-CM

## 2021-07-20 DIAGNOSIS — G453 Amaurosis fugax: Secondary | ICD-10-CM

## 2021-07-21 NOTE — Telephone Encounter (Signed)
Offered earlier appointment with another provider, declined

## 2021-07-21 NOTE — Telephone Encounter (Signed)
Wife calls in and states patient has had 18 vision blackouts since 6-16 and they last 3-5 minutes. Most of them are in the left eye and now starting to go to the right eye. Please call if he can be seen earlier.

## 2021-07-29 ENCOUNTER — Ambulatory Visit (INDEPENDENT_AMBULATORY_CARE_PROVIDER_SITE_OTHER): Payer: Medicare HMO | Admitting: Family Medicine

## 2021-07-29 ENCOUNTER — Encounter: Payer: Self-pay | Admitting: Family Medicine

## 2021-07-29 VITALS — BP 122/75 | HR 90 | Temp 98.1°F | Ht 68.0 in | Wt 161.0 lb

## 2021-07-29 DIAGNOSIS — R5383 Other fatigue: Secondary | ICD-10-CM | POA: Diagnosis not present

## 2021-07-29 DIAGNOSIS — M25572 Pain in left ankle and joints of left foot: Secondary | ICD-10-CM | POA: Diagnosis not present

## 2021-07-29 DIAGNOSIS — M25571 Pain in right ankle and joints of right foot: Secondary | ICD-10-CM | POA: Diagnosis not present

## 2021-07-29 DIAGNOSIS — G453 Amaurosis fugax: Secondary | ICD-10-CM

## 2021-07-29 NOTE — Progress Notes (Signed)
Subjective:  Patient ID: Michael Joseph, male    DOB: December 24, 1950  Age: 71 y.o. MRN: [id number]  CC: Referral   HPI Michael Joseph presents for multiple concerns including fatigue and night sweats for 2 mos. HE also has noted decreased vision, worse on the left starting on June 16. Left goes dark for 5 min at a time. Occurred 26 times. Most recently 5 days ago. This is accompanied y double vision. He has also noted pain in the lower legs and feet bilaterally onset on 5/24.   Went to E.D. on 6/22. Noted to have elevated CRP and Sed rate. EKG nml. MRI brain, orbits and CT angio of the head reviewed. CT angio negative for carotid or vertebral dx. MRI of brain &orbits is clear except for pansinusitis.        07/29/2021    2:46 PM 07/13/2021    2:42 PM 07/01/2021    9:49 AM  Depression screen PHQ 2/9  Decreased Interest 0 0 0  Down, Depressed, Hopeless 0 0 0  PHQ - 2 Score 0 0 0    History Michael Joseph has a past medical history of Allergy, Environmental allergies, Obstructive sleep apnea, and Personal history of colonic polyps - adenoma (08/28/2013).   He has a past surgical history that includes Wrist surgery (Left); Nasal septum surgery (1987); and Fracture surgery.   His family history includes Diabetes in his mother.He reports that he quit smoking about 18 years ago. His smoking use included pipe. He has never used smokeless tobacco. He reports current alcohol use. He reports that he does not use drugs.    ROS Review of Systems  Constitutional:  Positive for diaphoresis and fatigue. Negative for fever.  Eyes:  Positive for visual disturbance. Negative for photophobia.  Respiratory:  Negative for shortness of breath.   Cardiovascular:  Negative for chest pain and palpitations.  Musculoskeletal:  Positive for arthralgias (feet) and myalgias (lower legs).  Skin:  Negative for rash.  Psychiatric/Behavioral: Negative.  Negative for sleep disturbance.     Objective:  BP 122/75    Pulse 90   Temp 98.1 F (36.7 C)   Ht '5\' 8"'$  (1.727 m)   Wt 161 lb (73 kg)   SpO2 97%   BMI 24.48 kg/m   BP Readings from Last 3 Encounters:  07/29/21 122/75  07/16/21 140/75  07/13/21 125/74    Wt Readings from Last 3 Encounters:  07/29/21 161 lb (73 kg)  07/16/21 165 lb (74.8 kg)  07/13/21 165 lb (74.8 kg)     Physical Exam Vitals reviewed.  Constitutional:      Appearance: He is well-developed.  HENT:     Head: Normocephalic and atraumatic.     Right Ear: External ear normal.     Left Ear: External ear normal.     Mouth/Throat:     Pharynx: No oropharyngeal exudate or posterior oropharyngeal erythema.  Eyes:     Pupils: Pupils are equal, round, and reactive to light.  Cardiovascular:     Rate and Rhythm: Normal rate and regular rhythm.     Heart sounds: No murmur heard. Pulmonary:     Effort: No respiratory distress.     Breath sounds: Normal breath sounds.  Musculoskeletal:     Cervical back: Normal range of motion and neck supple.  Neurological:     Mental Status: He is alert and oriented to person, place, and time.       Assessment & Plan:   Michael Joseph  was seen today for referral.  Diagnoses and all orders for this visit:  Amaurosis fugax, left eye -     Sedimentation rate -     Rheumatoid factor -     RPR -     Vitamin B12 -     Cancel: ANA, IFA Comprehensive Panel -     HIV antibody (with reflex) -     Ambulatory referral to Ophthalmology  Pain in joints of both feet -     Sedimentation rate -     Rheumatoid factor -     RPR -     Vitamin B12 -     Cancel: ANA, IFA Comprehensive Panel -     HIV antibody (with reflex) -     ANA,IFA RA Diag Pnl w/rflx Tit/Patn  Fatigue, unspecified type -     Sedimentation rate -     Rheumatoid factor -     RPR -     Vitamin B12 -     Cancel: ANA, IFA Comprehensive Panel -     HIV antibody (with reflex) -     ANA,IFA RA Diag Pnl w/rflx Tit/Patn       I am having Michael Joseph maintain his  albuterol, fluticasone, fexofenadine-pseudoephedrine, finasteride, and tamsulosin.  Allergies as of 07/29/2021       Reactions   Amoxicillin    Skin rash        Medication List        Accurate as of July 29, 2021  3:37 PM. If you have any questions, ask your nurse or doctor.          albuterol 108 (90 Base) MCG/ACT inhaler Commonly known as: ProAir HFA Inhale 2 puffs into the lungs every 6 (six) hours as needed for wheezing or shortness of breath. 30-45 min prior to exercise Need to be seen   fexofenadine-pseudoephedrine 180-240 MG 24 hr tablet Commonly known as: ALLEGRA-D 24 Take 1 tablet by mouth every evening. For allergy and congestion   finasteride 5 MG tablet Commonly known as: PROSCAR TAKE 1 TABLET DAILY   fluticasone 50 MCG/ACT nasal spray Commonly known as: FLONASE Place 2 sprays into both nostrils daily.   tamsulosin 0.4 MG Caps capsule Commonly known as: FLOMAX TAKE 2 CAPSULES AT BEDTIME FOR URINE FLOW AND PROSTATE         Follow-up: Return in about 6 weeks (around 09/09/2021).  Claretta Fraise, M.D.

## 2021-07-30 LAB — SEDIMENTATION RATE: Sed Rate: 30 mm/hr (ref 0–30)

## 2021-07-30 LAB — RPR: RPR Ser Ql: NONREACTIVE

## 2021-07-30 LAB — RHEUMATOID FACTOR: Rheumatoid fact SerPl-aCnc: 19.3 IU/mL — ABNORMAL HIGH (ref <14.0)

## 2021-07-30 LAB — HIV ANTIBODY (ROUTINE TESTING W REFLEX): HIV Screen 4th Generation wRfx: NONREACTIVE

## 2021-07-30 LAB — VITAMIN B12: Vitamin B-12: 504 pg/mL (ref 232–1245)

## 2021-07-31 LAB — ANA,IFA RA DIAG PNL W/RFLX TIT/PATN
ANA Titer 1: NEGATIVE
Cyclic Citrullin Peptide Ab: 4 units (ref 0–19)
Rheumatoid fact SerPl-aCnc: 19.1 IU/mL — ABNORMAL HIGH (ref <14.0)

## 2021-08-03 ENCOUNTER — Ambulatory Visit (HOSPITAL_COMMUNITY): Payer: Medicare HMO

## 2021-08-05 DIAGNOSIS — H25813 Combined forms of age-related cataract, bilateral: Secondary | ICD-10-CM | POA: Diagnosis not present

## 2021-08-05 DIAGNOSIS — G453 Amaurosis fugax: Secondary | ICD-10-CM | POA: Diagnosis not present

## 2021-08-05 DIAGNOSIS — M316 Other giant cell arteritis: Secondary | ICD-10-CM | POA: Diagnosis not present

## 2021-08-07 ENCOUNTER — Other Ambulatory Visit: Payer: Self-pay

## 2021-08-07 DIAGNOSIS — M316 Other giant cell arteritis: Secondary | ICD-10-CM

## 2021-08-10 ENCOUNTER — Other Ambulatory Visit: Payer: Self-pay

## 2021-08-10 ENCOUNTER — Telehealth: Payer: Self-pay | Admitting: Family Medicine

## 2021-08-10 ENCOUNTER — Encounter (HOSPITAL_COMMUNITY): Payer: Self-pay | Admitting: Vascular Surgery

## 2021-08-10 NOTE — Telephone Encounter (Signed)
  Prescription Request  08/10/2021  Is this a "Controlled Substance" medicine? no  Have you seen your PCP in the last 2 weeks? 07/29/21  If YES, route message to pool  -  If NO, patient needs to be scheduled for appointment.  What is the name of the medication or equipment? Prednisone /dr Katy Fitch wrote it for him but dr stacks is supposed to take it over per pt  Have you contacted your pharmacy to request a refill? no   Which pharmacy would you like this sent to? [facility]   Patient notified that their request is being sent to the clinical staff for review and that they should receive a response within 2 business days.

## 2021-08-10 NOTE — Progress Notes (Signed)
Mr. Nelles denies chest pain or shortness of breath. Patient denies having any s/s of Covid in hishousehold.  Patient denies any known exposure to Covid.   PCP is Dr,Warren Chief Strategy Officer.

## 2021-08-10 NOTE — Anesthesia Preprocedure Evaluation (Addendum)
Anesthesia Evaluation  Patient identified by MRN, date of birth, ID band Patient awake    Reviewed: Allergy & Precautions, H&P , NPO status , Patient's Chart, lab work & pertinent test results  Airway Mallampati: III  TM Distance: >3 FB Neck ROM: Full    Dental no notable dental hx. (+) Teeth Intact, Dental Advisory Given   Pulmonary sleep apnea and Continuous Positive Airway Pressure Ventilation , former smoker,    Pulmonary exam normal breath sounds clear to auscultation       Cardiovascular Exercise Tolerance: Good negative cardio ROS   Rhythm:Regular Rate:Normal     Neuro/Psych negative neurological ROS  negative psych ROS   GI/Hepatic negative GI ROS, Neg liver ROS,   Endo/Other  negative endocrine ROS  Renal/GU negative Renal ROSLab Results      Component                Value               Date                      CREATININE               0.88                07/16/2021                K                        4.1                 07/16/2021                 negative genitourinary   Musculoskeletal   Abdominal   Peds  Hematology negative hematology ROS (+) Lab Results      Component                Value               Date                      WBC                      9.9                 07/16/2021                HGB                      13.6                07/16/2021               PLT                      501 (H)             07/16/2021              Anesthesia Other Findings   Reproductive/Obstetrics negative OB ROS                           Anesthesia Physical Anesthesia Plan  ASA: 3  Anesthesia Plan: MAC   Post-op Pain Management: Tylenol PO (pre-op)* and Minimal or no pain anticipated   Induction: Intravenous  PONV  Risk Score and Plan: 2 and Ondansetron and Propofol infusion  Airway Management Planned: Natural Airway and Simple Face Mask  Additional Equipment:    Intra-op Plan:   Post-operative Plan:   Informed Consent: I have reviewed the patients History and Physical, chart, labs and discussed the procedure including the risks, benefits and alternatives for the proposed anesthesia with the patient or authorized representative who has indicated his/her understanding and acceptance.     Dental advisory given  Plan Discussed with: CRNA  Anesthesia Plan Comments:        Anesthesia Quick Evaluation

## 2021-08-11 ENCOUNTER — Ambulatory Visit (HOSPITAL_BASED_OUTPATIENT_CLINIC_OR_DEPARTMENT_OTHER): Payer: Medicare HMO | Admitting: Certified Registered Nurse Anesthetist

## 2021-08-11 ENCOUNTER — Encounter (HOSPITAL_COMMUNITY): Payer: Self-pay | Admitting: Vascular Surgery

## 2021-08-11 ENCOUNTER — Ambulatory Visit (HOSPITAL_COMMUNITY)
Admission: RE | Admit: 2021-08-11 | Discharge: 2021-08-11 | Disposition: A | Discharge status: Home / Self Care | Payer: Medicare HMO | Source: Ambulatory Visit | Attending: Vascular Surgery | Admitting: Vascular Surgery

## 2021-08-11 ENCOUNTER — Ambulatory Visit (HOSPITAL_COMMUNITY): Payer: Medicare HMO | Admitting: Certified Registered Nurse Anesthetist

## 2021-08-11 ENCOUNTER — Other Ambulatory Visit: Payer: Self-pay

## 2021-08-11 ENCOUNTER — Encounter (HOSPITAL_COMMUNITY)
Admission: RE | Disposition: A | Discharge status: Home / Self Care | Payer: Self-pay | Source: Ambulatory Visit | Attending: Vascular Surgery

## 2021-08-11 DIAGNOSIS — Z87891 Personal history of nicotine dependence: Secondary | ICD-10-CM | POA: Diagnosis not present

## 2021-08-11 DIAGNOSIS — M316 Other giant cell arteritis: Secondary | ICD-10-CM

## 2021-08-11 DIAGNOSIS — H538 Other visual disturbances: Secondary | ICD-10-CM | POA: Diagnosis not present

## 2021-08-11 DIAGNOSIS — Z7952 Long term (current) use of systemic steroids: Secondary | ICD-10-CM | POA: Diagnosis not present

## 2021-08-11 DIAGNOSIS — G4733 Obstructive sleep apnea (adult) (pediatric): Secondary | ICD-10-CM | POA: Insufficient documentation

## 2021-08-11 DIAGNOSIS — Z9989 Dependence on other enabling machines and devices: Secondary | ICD-10-CM | POA: Diagnosis not present

## 2021-08-11 HISTORY — DX: Other complications of anesthesia, initial encounter: T88.59XA

## 2021-08-11 HISTORY — PX: ARTERY BIOPSY: SHX891

## 2021-08-11 LAB — POCT I-STAT, CHEM 8
BUN: 17 mg/dL (ref 8–23)
Calcium, Ion: 1.22 mmol/L (ref 1.15–1.40)
Chloride: 100 mmol/L (ref 98–111)
Creatinine, Ser: 0.7 mg/dL (ref 0.61–1.24)
Glucose, Bld: 121 mg/dL — ABNORMAL HIGH (ref 70–99)
HCT: 37 % — ABNORMAL LOW (ref 39.0–52.0)
Hemoglobin: 12.6 g/dL — ABNORMAL LOW (ref 13.0–17.0)
Potassium: 3.9 mmol/L (ref 3.5–5.1)
Sodium: 137 mmol/L (ref 135–145)
TCO2: 25 mmol/L (ref 22–32)

## 2021-08-11 SURGERY — BIOPSY TEMPORAL ARTERY
Anesthesia: Monitor Anesthesia Care | Laterality: Left

## 2021-08-11 MED ORDER — 0.9 % SODIUM CHLORIDE (POUR BTL) OPTIME
TOPICAL | Status: DC | PRN
  Administered 2021-08-11: 1000 mL

## 2021-08-11 MED ORDER — TRAMADOL HCL 50 MG PO TABS: 50.0000 mg | ORAL_TABLET | Freq: Four times a day (QID) | ORAL | 0 refills | Status: DC | PRN

## 2021-08-11 MED ORDER — VANCOMYCIN HCL IN DEXTROSE 1-5 GM/200ML-% IV SOLN
1000.0000 mg | INTRAVENOUS | Status: AC
  Filled 2021-08-11: qty 200

## 2021-08-11 MED ORDER — CHLORHEXIDINE GLUCONATE 0.12 % MT SOLN: 15.0000 mL | Freq: Once | OROMUCOSAL | Status: AC

## 2021-08-11 MED ORDER — PROPOFOL 10 MG/ML IV BOLUS
INTRAVENOUS | Status: AC
  Filled 2021-08-11: qty 20

## 2021-08-11 MED ORDER — CHLORHEXIDINE GLUCONATE 4 % EX LIQD: 60.0000 mL | Freq: Once | CUTANEOUS | Status: DC

## 2021-08-11 MED ORDER — FENTANYL CITRATE (PF) 100 MCG/2ML IJ SOLN: 25.0000 ug | INTRAMUSCULAR | Status: DC | PRN

## 2021-08-11 MED ORDER — ONDANSETRON HCL 4 MG/2ML IJ SOLN
INTRAMUSCULAR | Status: DC | PRN
  Administered 2021-08-11: 4 mg via INTRAVENOUS

## 2021-08-11 MED ORDER — ONDANSETRON HCL 4 MG/2ML IJ SOLN
4.0000 mg | Freq: Once | INTRAMUSCULAR | Status: DC | PRN
Start: 2021-08-11 — End: 2021-08-11

## 2021-08-11 MED ORDER — ORAL CARE MOUTH RINSE: 15.0000 mL | Freq: Once | OROMUCOSAL | Status: AC

## 2021-08-11 MED ORDER — SODIUM CHLORIDE 0.9 % IV SOLN: INTRAVENOUS | Status: DC

## 2021-08-11 MED ORDER — LACTATED RINGERS IV SOLN: INTRAVENOUS | Status: DC

## 2021-08-11 MED ORDER — CHLORHEXIDINE GLUCONATE 0.12 % MT SOLN
OROMUCOSAL | Status: AC
  Administered 2021-08-11: 15 mL via OROMUCOSAL
  Filled 2021-08-11: qty 15

## 2021-08-11 MED ORDER — ACETAMINOPHEN 500 MG PO TABS
ORAL_TABLET | ORAL | Status: AC
  Administered 2021-08-11: 1000 mg via ORAL
  Filled 2021-08-11: qty 2

## 2021-08-11 MED ORDER — LIDOCAINE-EPINEPHRINE (PF) 1 %-1:200000 IJ SOLN
INTRAMUSCULAR | Status: AC
  Filled 2021-08-11: qty 30

## 2021-08-11 MED ORDER — ACETAMINOPHEN 500 MG PO TABS: 1000.0000 mg | ORAL_TABLET | Freq: Once | ORAL | Status: AC

## 2021-08-11 MED ORDER — PROPOFOL 10 MG/ML IV BOLUS
INTRAVENOUS | Status: DC | PRN
  Administered 2021-08-11: 20 mg via INTRAVENOUS

## 2021-08-11 MED ORDER — ACETAMINOPHEN 10 MG/ML IV SOLN: 1000.0000 mg | Freq: Once | INTRAVENOUS | Status: DC | PRN

## 2021-08-11 MED ORDER — LIDOCAINE-EPINEPHRINE (PF) 1 %-1:200000 IJ SOLN
INTRAMUSCULAR | Status: DC | PRN
  Administered 2021-08-11: 3 mL via INTRADERMAL

## 2021-08-11 MED ORDER — VANCOMYCIN HCL IN DEXTROSE 1-5 GM/200ML-% IV SOLN
INTRAVENOUS | Status: AC
  Administered 2021-08-11: 1000 mg via INTRAVENOUS
  Filled 2021-08-11: qty 200

## 2021-08-11 SURGICAL SUPPLY — 48 items
ADH SKN CLS APL DERMABOND .7 (GAUZE/BANDAGES/DRESSINGS) ×1
BAG COUNTER SPONGE SURGICOUNT (BAG) ×3 IMPLANT
BAG SPNG CNTER NS LX DISP (BAG) ×1
BALL CTTN LRG ABS STRL LF (GAUZE/BANDAGES/DRESSINGS) ×1
CANISTER SUCT 3000ML PPV (MISCELLANEOUS) ×3 IMPLANT
CNTNR URN SCR LID CUP LEK RST (MISCELLANEOUS) ×2 IMPLANT
CONT SPEC 4OZ STRL OR WHT (MISCELLANEOUS) ×2
COTTONBALL LRG STERILE PKG (GAUZE/BANDAGES/DRESSINGS) ×3 IMPLANT
COVER SURGICAL LIGHT HANDLE (MISCELLANEOUS) ×4 IMPLANT
DERMABOND ADVANCED (GAUZE/BANDAGES/DRESSINGS) ×1
DERMABOND ADVANCED .7 DNX12 (GAUZE/BANDAGES/DRESSINGS) ×2 IMPLANT
DRAPE OPHTHALMIC 77X100 STRL (CUSTOM PROCEDURE TRAY) ×3 IMPLANT
ELECT NDL BLADE 2-5/6 (NEEDLE) ×2 IMPLANT
ELECT NEEDLE BLADE 2-5/6 (NEEDLE) ×2 IMPLANT
ELECT REM PT RETURN 9FT ADLT (ELECTROSURGICAL) ×2
ELECTRODE REM PT RTRN 9FT ADLT (ELECTROSURGICAL) ×2 IMPLANT
GAUZE 4X4 16PLY ~~LOC~~+RFID DBL (SPONGE) ×4 IMPLANT
GEL ULTRASOUND 8.5O AQUASONIC (MISCELLANEOUS) ×3 IMPLANT
GLOVE BIO SURGEON STRL SZ7.5 (GLOVE) ×3 IMPLANT
GLOVE BIOGEL PI IND STRL 8 (GLOVE) ×2 IMPLANT
GLOVE BIOGEL PI INDICATOR 8 (GLOVE) ×1
GLOVE SRG 8 PF TXTR STRL LF DI (GLOVE) ×2 IMPLANT
GLOVE SURG POLYISO LF SZ8 (GLOVE) IMPLANT
GLOVE SURG UNDER POLY LF SZ8 (GLOVE) ×2
GOWN STRL REUS W/ TWL LRG LVL3 (GOWN DISPOSABLE) ×2 IMPLANT
GOWN STRL REUS W/TWL 2XL LVL3 (GOWN DISPOSABLE) ×6 IMPLANT
GOWN STRL REUS W/TWL LRG LVL3 (GOWN DISPOSABLE) ×2
KIT BASIN OR (CUSTOM PROCEDURE TRAY) ×3 IMPLANT
KIT TURNOVER KIT B (KITS) ×3 IMPLANT
LOOP VESSEL MINI RED (MISCELLANEOUS) ×3 IMPLANT
NDL HYPO 25GX1X1/2 BEV (NEEDLE) ×2 IMPLANT
NEEDLE HYPO 25GX1X1/2 BEV (NEEDLE) ×2 IMPLANT
NS IRRIG 1000ML POUR BTL (IV SOLUTION) ×3 IMPLANT
PACK GENERAL/GYN (CUSTOM PROCEDURE TRAY) ×3 IMPLANT
PAD ARMBOARD 7.5X6 YLW CONV (MISCELLANEOUS) ×6 IMPLANT
SPIKE FLUID TRANSFER (MISCELLANEOUS) ×3 IMPLANT
SPONGE INTESTINAL PEANUT (DISPOSABLE) ×1 IMPLANT
SUCTION FRAZIER HANDLE 10FR (MISCELLANEOUS) ×2
SUCTION TUBE FRAZIER 10FR DISP (MISCELLANEOUS) ×2 IMPLANT
SUT MNCRL AB 4-0 PS2 18 (SUTURE) ×3 IMPLANT
SUT PROLENE 6 0 BV (SUTURE) IMPLANT
SUT SILK 3 0 (SUTURE) ×2
SUT SILK 3-0 18XBRD TIE 12 (SUTURE) IMPLANT
SUT VIC AB 3-0 SH 27 (SUTURE) ×2
SUT VIC AB 3-0 SH 27X BRD (SUTURE) ×2 IMPLANT
SYR CONTROL 10ML LL (SYRINGE) ×3 IMPLANT
TOWEL GREEN STERILE (TOWEL DISPOSABLE) ×3 IMPLANT
WATER STERILE IRR 1000ML POUR (IV SOLUTION) ×2 IMPLANT

## 2021-08-11 NOTE — H&P (Signed)
Hospital H&PP    Reason for Consult: Concern for temporal arteritis Requesting Physician: Dr. Katy Fitch MRN #:  [id number]  History of Present Illness: This is a 71 y.o. male referred to me from Dr. Katy Fitch with concern for temporal arteritis.  A native of used to [place] has been living in the [place] area for a number of years.  He is now retired.  Patient first appreciated recurrent blackout episodes in the left eye a month ago.  He denies headache.  Last episode was 1 week ago.  Denies Fever, chills.  Currently on prednisone.  Past Medical History:  Diagnosis Date   Allergy    Complication of anesthesia    Environmental allergies    Obstructive sleep apnea    Personal history of colonic polyps - adenoma 08/28/2013    Past Surgical History:  Procedure Laterality Date   FRACTURE SURGERY     left wrist   NASAL SEPTUM SURGERY  1987   WRIST SURGERY Left     Allergies  Allergen Reactions   Amoxicillin Rash    Skin rash    Prior to Admission medications   Medication Sig Start Date End Date Taking? Authorizing Provider  Ascorbic Acid (VITAMIN C PO) Take 1 tablet by mouth daily.   Yes [provider]  docusate sodium (COLACE) 100 MG capsule Take 100 mg by mouth in the morning.   Yes [provider]  finasteride (PROSCAR) 5 MG tablet TAKE 1 TABLET DAILY 07/06/21  Yes Stacks, Cletus Gash, MD  fluticasone (FLONASE) 50 MCG/ACT nasal spray Place 2 sprays into both nostrils daily. Patient taking differently: Place 2 sprays into both nostrils every evening. 10/10/19  Yes Claretta Fraise, MD  Multiple Vitamin (MULTIVITAMIN WITH MINERALS) TABS tablet Take 1 tablet by mouth in the morning.   Yes [provider]  predniSONE (DELTASONE) 20 MG tablet Take 100 mg by mouth daily. 08/05/21  Yes [provider]  tamsulosin (FLOMAX) 0.4 MG CAPS capsule TAKE 2 CAPSULES AT BEDTIME FOR URINE FLOW AND PROSTATE 07/06/21  Yes Claretta Fraise, MD  albuterol  (PROAIR HFA) 108 (90 Base) MCG/ACT inhaler Inhale 2 puffs into the lungs every 6 (six) hours as needed for wheezing or shortness of breath. 30-45 min prior to exercise Need to be seen 10/10/19   Claretta Fraise, MD  fexofenadine-pseudoephedrine (ALLEGRA-D 24) 180-240 MG 24 hr tablet Take 1 tablet by mouth every evening. For allergy and congestion Patient taking differently: Take 1 tablet by mouth daily as needed (allergy/congestion.). 02/04/21   Claretta Fraise, MD    Social History   Socioeconomic History   Marital status: Married    Spouse name: Manuela Schwartz   Number of children: 2   Years of education: Not on file   Highest education level: Not on file  Occupational History   Not on file  Tobacco Use   Smoking status: Former    Types: Pipe    Quit date: 01/26/2003    Years since quitting: 18.5   Smokeless tobacco: Never  Vaping Use   Vaping Use: Never used  Substance and Sexual Activity   Alcohol use: Yes    Alcohol/week: 2.0 standard drinks of alcohol    Types: 2 Cans of beer per week    Comment: occ   Drug use: No   Sexual activity: Yes  Other Topics Concern   Not on file  Social History Narrative   Married x 42 years.    1 grandchildren   Social Determinants of Health  Financial Resource Strain: Low Risk  (03/30/2021)   Overall Financial Resource Strain (CARDIA)    Difficulty of Paying Living Expenses: Not hard at all  Food Insecurity: No Food Insecurity (03/30/2021)   Hunger Vital Sign    Worried About Running Out of Food in the Last Year: Never true    Ran Out of Food in the Last Year: Never true  Transportation Needs: No Transportation Needs (03/30/2021)   PRAPARE - Hydrologist (Medical): No    Lack of Transportation (Non-Medical): No  Physical Activity: Sufficiently Active (03/30/2021)   Exercise Vital Sign    Days of Exercise per Week: 5 days    Minutes of Exercise per Session: 30 min  Stress: No Stress Concern Present (03/30/2021)   [place]    Feeling of Stress : Not at all  Social Connections: [place] (03/30/2021)   Social Connection and Isolation Panel [NHANES]    Frequency of Communication with Friends and Family: More than three times a week    Frequency of Social Gatherings with Friends and Family: More than three times a week    Attends Religious Services: More than 4 times per year    Active Member of [employer] or Organizations: Yes    Attends Music therapist: More than 4 times per year    Marital Status: Married  Human resources officer Violence: Not At Risk (03/30/2021)   Humiliation, Afraid, Rape, and Kick questionnaire    Fear of Current or Ex-Partner: No    Emotionally Abused: No    Physically Abused: No    Sexually Abused: No   Family History  Problem Relation Age of Onset   Diabetes Mother    Colon cancer Neg Hx    Pancreatic cancer Neg Hx    Rectal cancer Neg Hx    Stomach cancer Neg Hx     ROS: Otherwise negative unless mentioned in HPI  Physical Examination  Vitals:   08/11/21 0602  BP: (!) 160/75  Pulse: (!) 55  Resp: 17  Temp: 97.6 F (36.4 C)  SpO2: 97%   Body mass index is 24.33 kg/m.  General:  WDWN in NAD Gait: Not observed HENT: WNL, normocephalic Pulmonary: normal non-labored breathing, without Rales, rhonchi,  wheezing Cardiac: regular Abdomen: soft, NT/ND, no masses Skin: without rashes Vascular Exam/Pulses: 2+ radials Extremities: Warm Musculoskeletal: no muscle wasting or atrophy  Neurologic: A&O X 3;  No focal weakness or paresthesias are detected; speech is fluent/normal Psychiatric:  The pt has Normal affect. Lymph:  Unremarkable  CBC    Component Value Date/Time   WBC 9.9 07/16/2021 1524   RBC 4.98 07/16/2021 1524   HGB 13.6 07/16/2021 1524   HGB 12.5 (L) 07/01/2021 1000   HCT 42.3 07/16/2021 1524   HCT 38.3 07/01/2021 1000   PLT 501 (H) 07/16/2021 1524   PLT 679 (H)  07/01/2021 1000   MCV 84.9 07/16/2021 1524   MCV 85 07/01/2021 1000   MCH 27.3 07/16/2021 1524   MCHC 32.2 07/16/2021 1524   RDW 12.3 07/16/2021 1524   RDW 12.4 07/01/2021 1000   LYMPHSABS 1.8 07/16/2021 1524   LYMPHSABS 2.1 10/31/2018 1515   MONOABS 0.6 07/16/2021 1524   EOSABS 0.3 07/16/2021 1524   EOSABS 0.4 10/31/2018 1515   BASOSABS 0.0 07/16/2021 1524   BASOSABS 0.1 10/31/2018 1515    BMET    Component Value Date/Time   NA 136 07/16/2021 1524  NA 137 07/01/2021 1000   K 4.1 07/16/2021 1524   CL 101 07/16/2021 1524   CO2 27 07/16/2021 1524   GLUCOSE 109 (H) 07/16/2021 1524   BUN 13 07/16/2021 1524   BUN 11 07/01/2021 1000   CREATININE 0.88 07/16/2021 1524   CALCIUM 8.9 07/16/2021 1524   GFRNONAA >60 07/16/2021 1524   GFRAA 103 10/31/2018 1515    COAGS: Lab Results  Component Value Date   INR 1.1 07/12/2021     Non-Invasive Vascular Imaging:   -   ASSESSMENT/PLAN: This is a 71 y.o. male with concern for temporal arteritis.  After discussing the risk and benefits of temporal artery biopsy, Herbie Baltimore elected to proceed.  I will harvest the left temporal artery as this is the symptomatic side.   Cassandria Santee MD MS [facility] [phone] 08/11/2021  7:27 AM

## 2021-08-11 NOTE — Transfer of Care (Signed)
Immediate Anesthesia Transfer of Care Note  Patient: Michael Joseph  Procedure(s) Performed: TEMPORAL ARTERY BIOPSY (Left)  Patient Location: PACU  Anesthesia Type:MAC  Level of Consciousness: awake, alert  and oriented  Airway & Oxygen Therapy: Patient Spontanous Breathing  Post-op Assessment: Report given to RN and Post -op Vital signs reviewed and stable  Post vital signs: Reviewed and stable  Last Vitals:  Vitals Value Taken Time  BP 155/77 08/11/21 0845  Temp 36.4 C 08/11/21 0845  Pulse 46 08/11/21 0846  Resp 17 08/11/21 0846  SpO2 97 % 08/11/21 0846  Vitals shown include unvalidated device data.  Last Pain:  Vitals:   08/11/21 0830  TempSrc:   PainSc: 0-No pain         Complications: No notable events documented.

## 2021-08-11 NOTE — Anesthesia Postprocedure Evaluation (Signed)
Anesthesia Post Note  Patient: Michael Joseph  Procedure(s) Performed: TEMPORAL ARTERY BIOPSY (Left)     Patient location during evaluation: PACU Anesthesia Type: MAC Level of consciousness: awake and alert Pain management: pain level controlled Vital Signs Assessment: post-procedure vital signs reviewed and stable Respiratory status: spontaneous breathing, nonlabored ventilation and respiratory function stable Cardiovascular status: stable and blood pressure returned to baseline Postop Assessment: no apparent nausea or vomiting Anesthetic complications: no   No notable events documented.  Last Vitals:  Vitals:   08/11/21 0830 08/11/21 0845  BP: (!) 157/68 (!) 155/77  Pulse: (!) 47 (!) 46  Resp:  14  Temp: 36.6 C (!) 36.4 C  SpO2: 98% 98%    Last Pain:  Vitals:   08/11/21 0830  TempSrc:   PainSc: 0-No pain                 Yousra Ivens,W. EDMOND

## 2021-08-11 NOTE — Op Note (Signed)
    NAME: Michael Joseph    MRN: [id number] DOB: [birth year]    DATE OF OPERATION: 08/11/2021  PREOP DIAGNOSIS:    Concern for temporal arteritis  POSTOP DIAGNOSIS:    Same  PROCEDURE:    Temporal artery biopsy  SURGEON: Broadus John  ASSIST: None  ANESTHESIA: Moderate  EBL: 5 mL  INDICATIONS:    Michael Joseph is a 71 y.o. male referred to me from Dr. Katy Fitch with concern for temporal arteritis.  A native of used to [place] has been living in the [place] area for a number of years.  He is now retired. Patient first appreciated recurrent blackout episodes in the left eye a month ago.  He denies headache.  Last episode was 1 week ago.  Inflammatory markers were elevated. After discussing the risk and benefits of temporal artery biopsy, Michael Joseph elected to proceed.  I will harvest the left temporal artery as this is the symptomatic side.  FINDINGS:   2 mm temporal artery  TECHNIQUE:   Patient was brought to the OR laid in the supine position.  Moderate anesthesia was induced and the patient was prepped and draped in standard fashion.  Case began with ultrasound insonation of the left temporal artery.  This was marked.  Next, local anesthetic was brought to the field and the longitudinal incision anesthetized.  A 3 cm incision was made and carried to the temporal artery and vein.  The vein was overlying the artery, therefore it was resected.  Next, the vein was visualized and roughly 2 cm was dissected free.  A clip was placed distally, artery transected and allowed to bleed. This demonstrated pulsatile blood flow.  Next, a 3-0 silk suture was used proximally followed by a small clip.  The temporal artery was resected and placed in a container for pathology.  The wound was irrigated with saline, and closed with 3-0 Vicryl suture deep, Monocryl and Dermabond at the skin.  Macie Burows, MD [facility] DATE OF DICTATION:    08/11/2021

## 2021-08-11 NOTE — Anesthesia Procedure Notes (Signed)
Procedure Name: MAC Date/Time: 08/11/2021 7:45 AM  Performed by: Dorthea Cove, CRNAPre-anesthesia Checklist: Patient identified, Emergency Drugs available, Suction available, Patient being monitored and Timeout performed Patient Re-evaluated:Patient Re-evaluated prior to induction Oxygen Delivery Method: Nasal cannula Preoxygenation: Pre-oxygenation with 100% oxygen Induction Type: IV induction Placement Confirmation: positive ETCO2 and CO2 detector Dental Injury: Teeth and Oropharynx as per pre-operative assessment

## 2021-08-12 ENCOUNTER — Encounter (HOSPITAL_COMMUNITY): Payer: Self-pay | Admitting: Vascular Surgery

## 2021-08-16 ENCOUNTER — Other Ambulatory Visit: Payer: Self-pay | Admitting: Family Medicine

## 2021-08-16 MED ORDER — PREDNISONE 20 MG PO TABS: 100.0000 mg | ORAL_TABLET | Freq: Every day | ORAL | 0 refills | Status: DC

## 2021-08-16 NOTE — Telephone Encounter (Signed)
Please let the patient know that I sent their prescription to their pharmacy. Thanks, WS 

## 2021-08-17 NOTE — Telephone Encounter (Signed)
PATIENT AWARE

## 2021-08-19 LAB — SURGICAL PATHOLOGY

## 2021-08-25 ENCOUNTER — Ambulatory Visit (INDEPENDENT_AMBULATORY_CARE_PROVIDER_SITE_OTHER): Payer: Medicare HMO

## 2021-08-25 ENCOUNTER — Encounter: Payer: Self-pay | Admitting: Family Medicine

## 2021-08-25 ENCOUNTER — Ambulatory Visit (INDEPENDENT_AMBULATORY_CARE_PROVIDER_SITE_OTHER): Payer: Medicare HMO | Admitting: Family Medicine

## 2021-08-25 VITALS — BP 127/74 | HR 60 | Temp 98.6°F | Ht 68.0 in | Wt 150.2 lb

## 2021-08-25 DIAGNOSIS — R059 Cough, unspecified: Secondary | ICD-10-CM | POA: Diagnosis not present

## 2021-08-25 DIAGNOSIS — E559 Vitamin D deficiency, unspecified: Secondary | ICD-10-CM | POA: Diagnosis not present

## 2021-08-25 DIAGNOSIS — N401 Enlarged prostate with lower urinary tract symptoms: Secondary | ICD-10-CM

## 2021-08-25 DIAGNOSIS — G4733 Obstructive sleep apnea (adult) (pediatric): Secondary | ICD-10-CM | POA: Diagnosis not present

## 2021-08-25 DIAGNOSIS — Z0001 Encounter for general adult medical examination with abnormal findings: Secondary | ICD-10-CM

## 2021-08-25 DIAGNOSIS — R6889 Other general symptoms and signs: Secondary | ICD-10-CM | POA: Diagnosis not present

## 2021-08-25 DIAGNOSIS — R0602 Shortness of breath: Secondary | ICD-10-CM | POA: Diagnosis not present

## 2021-08-25 DIAGNOSIS — M316 Other giant cell arteritis: Secondary | ICD-10-CM

## 2021-08-25 DIAGNOSIS — J449 Chronic obstructive pulmonary disease, unspecified: Secondary | ICD-10-CM | POA: Diagnosis not present

## 2021-08-25 DIAGNOSIS — Z0000 Encounter for general adult medical examination without abnormal findings: Secondary | ICD-10-CM

## 2021-08-25 DIAGNOSIS — E871 Hypo-osmolality and hyponatremia: Secondary | ICD-10-CM | POA: Diagnosis not present

## 2021-08-25 DIAGNOSIS — Z136 Encounter for screening for cardiovascular disorders: Secondary | ICD-10-CM | POA: Diagnosis not present

## 2021-08-25 DIAGNOSIS — R350 Frequency of micturition: Secondary | ICD-10-CM | POA: Diagnosis not present

## 2021-08-25 LAB — URINALYSIS
Bilirubin, UA: NEGATIVE
Glucose, UA: NEGATIVE
Ketones, UA: NEGATIVE
Leukocytes,UA: NEGATIVE
Nitrite, UA: NEGATIVE
Protein,UA: NEGATIVE
RBC, UA: NEGATIVE
Specific Gravity, UA: 1.01 (ref 1.005–1.030)
Urobilinogen, Ur: 0.2 mg/dL (ref 0.2–1.0)
pH, UA: 7 (ref 5.0–7.5)

## 2021-08-25 MED ORDER — PREDNISONE 20 MG PO TABS: ORAL_TABLET | ORAL | 0 refills | Status: DC

## 2021-08-25 MED ORDER — TAMSULOSIN HCL 0.4 MG PO CAPS: ORAL_CAPSULE | ORAL | 3 refills | Status: DC

## 2021-08-25 NOTE — Progress Notes (Signed)
Subjective:  Patient ID: Michael Joseph, male    DOB: 1950-03-09  Age: 71 y.o. MRN: [id number]  CC: Annual Exam   HPI [name] presents for Annual exam.  Recent Giant cell arteritis was treated with prednisone. Ready for taper per Dr. Katy Fitch. Pt. Reports he felt better within a few hours of starting the steroid treatment. Has not had any more amaurosis fugax occur.      08/25/2021   11:12 AM 07/29/2021    2:46 PM 07/13/2021    2:42 PM  Depression screen PHQ 2/9  Decreased Interest 0 0 0  Down, Depressed, Hopeless 0 0 0  PHQ - 2 Score 0 0 0    History Raymone has a past medical history of Allergy, Complication of anesthesia, Environmental allergies, Obstructive sleep apnea, and Personal history of colonic polyps - adenoma (08/28/2013).   He has a past surgical history that includes Wrist surgery (Left); Nasal septum surgery (1987); Fracture surgery; and Artery Biopsy (Left, 08/11/2021).   His family history includes Diabetes in his mother.He reports that he quit smoking about 18 years ago. His smoking use included pipe. He has never used smokeless tobacco. He reports current alcohol use of about 2.0 standard drinks of alcohol per week. He reports that he does not use drugs.    ROS Review of Systems  Constitutional:  Negative for activity change, fatigue and unexpected weight change.  HENT:  Negative for congestion, ear pain, hearing loss, postnasal drip and trouble swallowing.   Respiratory:  Negative for cough, chest tightness and shortness of breath.   Cardiovascular:  Negative for chest pain, palpitations and leg swelling.  Gastrointestinal:  Negative for abdominal distention, abdominal pain, blood in stool, constipation, diarrhea, nausea and vomiting.  Endocrine: Negative for cold intolerance, heat intolerance and polydipsia.  Genitourinary:  Negative for difficulty urinating, dysuria, flank pain, frequency and urgency.  Musculoskeletal:  Negative for arthralgias and  joint swelling.  Skin:  Negative for color change, rash and wound.  Neurological:  Negative for dizziness, syncope, speech difficulty, weakness, light-headedness, numbness and headaches.  Hematological:  Does not bruise/bleed easily.  Psychiatric/Behavioral:  Negative for confusion, decreased concentration, dysphoric mood and sleep disturbance. The patient is not nervous/anxious.     Objective:  BP 127/74   Pulse 60   Temp 98.6 F (37 C)   Ht '5\' 8"'  (1.727 m)   Wt 150 lb 3.2 oz (68.1 kg)   SpO2 96%   BMI 22.84 kg/m   BP Readings from Last 3 Encounters:  08/25/21 127/74  08/11/21 (!) 155/77  07/29/21 122/75    Wt Readings from Last 3 Encounters:  08/25/21 150 lb 3.2 oz (68.1 kg)  08/11/21 160 lb (72.6 kg)  07/29/21 161 lb (73 kg)     Physical Exam Constitutional:      Appearance: He is well-developed.  HENT:     Head: Normocephalic and atraumatic.  Eyes:     Pupils: Pupils are equal, round, and reactive to light.  Neck:     Thyroid: No thyromegaly.     Trachea: No tracheal deviation.  Cardiovascular:     Rate and Rhythm: Normal rate and regular rhythm.     Heart sounds: Normal heart sounds. No murmur heard.    No friction rub. No gallop.  Pulmonary:     Breath sounds: Normal breath sounds. No wheezing or rales.  Abdominal:     General: Bowel sounds are normal. There is no distension.     Palpations:  Abdomen is soft. There is no mass.     Tenderness: There is no abdominal tenderness.     Hernia: There is no hernia in the left inguinal area.  Genitourinary:    Penis: Normal.      Testes: Normal.  Musculoskeletal:        General: Normal range of motion.     Cervical back: Normal range of motion.  Lymphadenopathy:     Cervical: No cervical adenopathy.  Skin:    General: Skin is warm and dry.  Neurological:     Mental Status: He is alert and oriented to person, place, and time.       Assessment & Plan:   [name] was seen today for annual  exam.  Diagnoses and all orders for this visit:  Well adult exam -     CBC with Differential/Platelet -     CMP14+EGFR -     Lipid panel  Giant cell arteritis ([place])  Obstructive sleep apnea  Benign prostatic hyperplasia with urinary frequency -     PSA, total and free -     Urinalysis  Vitamin D deficiency -     VITAMIN D 25 Hydroxy (Vit-D Deficiency, Fractures)  Abnormal findings on auscultation -     DG Chest 2 View; Future  Other orders -     tamsulosin (FLOMAX) 0.4 MG CAPS capsule; TAKE 2 CAPSULES AT BEDTIME FOR URINE FLOW AND PROSTATE -     predniSONE (DELTASONE) 20 MG tablet; Take 3 daily for one more week. Then take 2 a day for 1 week.  Then take 1 daily for 1 week. Then DC       I have changed Khairi G. Lust's predniSONE. I am also having him maintain his albuterol, fluticasone, fexofenadine-pseudoephedrine, finasteride, multivitamin with minerals, Ascorbic Acid (VITAMIN C PO), docusate sodium, traMADol, and tamsulosin.  Allergies as of 08/25/2021       Reactions   Amoxicillin Rash   Skin rash        Medication List        Accurate as of August 25, 2021 11:51 AM. If you have any questions, ask your nurse or doctor.          albuterol 108 (90 Base) MCG/ACT inhaler Commonly known as: ProAir HFA Inhale 2 puffs into the lungs every 6 (six) hours as needed for wheezing or shortness of breath. 30-45 min prior to exercise Need to be seen   docusate sodium 100 MG capsule Commonly known as: COLACE Take 100 mg by mouth in the morning.   fexofenadine-pseudoephedrine 180-240 MG 24 hr tablet Commonly known as: ALLEGRA-D 24 Take 1 tablet by mouth every evening. For allergy and congestion What changed:  when to take this reasons to take this additional instructions   finasteride 5 MG tablet Commonly known as: PROSCAR TAKE 1 TABLET DAILY   fluticasone 50 MCG/ACT nasal spray Commonly known as: FLONASE Place 2 sprays into both nostrils daily. What  changed: when to take this   multivitamin with minerals Tabs tablet Take 1 tablet by mouth in the morning.   predniSONE 20 MG tablet Commonly known as: DELTASONE Take 3 daily for one more week. Then take 2 a day for 1 week.  Then take 1 daily for 1 week. Then DC What changed:  how much to take how to take this when to take this additional instructions Changed by: Claretta Fraise, MD   tamsulosin 0.4 MG Caps capsule Commonly known as: FLOMAX TAKE 2 CAPSULES  AT BEDTIME FOR URINE FLOW AND PROSTATE   traMADol 50 MG tablet Commonly known as: Ultram Take 1 tablet (50 mg total) by mouth every 6 (six) hours as needed for moderate pain.   VITAMIN C PO Take 1 tablet by mouth daily.         Follow-up: Return in about 6 months (around 02/25/2022), or if symptoms worsen or fail to improve.  Claretta Fraise, M.D.

## 2021-08-26 LAB — CMP14+EGFR
ALT: 35 IU/L (ref 0–44)
AST: 18 IU/L (ref 0–40)
Albumin/Globulin Ratio: 1.9 (ref 1.2–2.2)
Albumin: 4.2 g/dL (ref 3.9–4.9)
Alkaline Phosphatase: 90 IU/L (ref 44–121)
BUN/Creatinine Ratio: 19 (ref 10–24)
BUN: 14 mg/dL (ref 8–27)
Bilirubin Total: 0.9 mg/dL (ref 0.0–1.2)
CO2: 23 mmol/L (ref 20–29)
Calcium: 9.1 mg/dL (ref 8.6–10.2)
Chloride: 97 mmol/L (ref 96–106)
Creatinine, Ser: 0.72 mg/dL — ABNORMAL LOW (ref 0.76–1.27)
Globulin, Total: 2.2 g/dL (ref 1.5–4.5)
Glucose: 110 mg/dL — ABNORMAL HIGH (ref 70–99)
Potassium: 4.6 mmol/L (ref 3.5–5.2)
Sodium: 133 mmol/L — ABNORMAL LOW (ref 134–144)
Total Protein: 6.4 g/dL (ref 6.0–8.5)
eGFR: 98 mL/min/{1.73_m2} (ref >59)

## 2021-08-26 LAB — LIPID PANEL
Chol/HDL Ratio: 3 ratio (ref 0.0–5.0)
Cholesterol, Total: 214 mg/dL — ABNORMAL HIGH (ref 100–199)
HDL: 72 mg/dL (ref >39)
LDL Chol Calc (NIH): 125 mg/dL — ABNORMAL HIGH (ref 0–99)
Triglycerides: 96 mg/dL (ref 0–149)
VLDL Cholesterol Cal: 17 mg/dL (ref 5–40)

## 2021-08-26 LAB — PSA, TOTAL AND FREE
PSA, Free Pct: 10.4 %
PSA, Free: 0.26 ng/mL
Prostate Specific Ag, Serum: 2.5 ng/mL (ref 0.0–4.0)

## 2021-08-26 LAB — CBC WITH DIFFERENTIAL/PLATELET
Basophils Absolute: 0 10*3/uL (ref 0.0–0.2)
Basos: 0 %
EOS (ABSOLUTE): 0 10*3/uL (ref 0.0–0.4)
Eos: 0 %
Hematocrit: 44.3 % (ref 37.5–51.0)
Hemoglobin: 14.5 g/dL (ref 13.0–17.7)
Immature Grans (Abs): 0.1 10*3/uL (ref 0.0–0.1)
Immature Granulocytes: 1 %
Lymphocytes Absolute: 1.2 10*3/uL (ref 0.7–3.1)
Lymphs: 10 %
MCH: 26.7 pg (ref 26.6–33.0)
MCHC: 32.7 g/dL (ref 31.5–35.7)
MCV: 81 fL (ref 79–97)
Monocytes Absolute: 0.5 10*3/uL (ref 0.1–0.9)
Monocytes: 4 %
Neutrophils Absolute: 10.6 10*3/uL — ABNORMAL HIGH (ref 1.4–7.0)
Neutrophils: 85 %
Platelets: 267 10*3/uL (ref 150–450)
RBC: 5.44 x10E6/uL (ref 4.14–5.80)
RDW: 15.8 % — ABNORMAL HIGH (ref 11.6–15.4)
WBC: 12.4 10*3/uL — ABNORMAL HIGH (ref 3.4–10.8)

## 2021-08-26 LAB — VITAMIN D 25 HYDROXY (VIT D DEFICIENCY, FRACTURES): Vit D, 25-Hydroxy: 27.3 ng/mL — ABNORMAL LOW (ref 30.0–100.0)

## 2021-08-26 NOTE — Progress Notes (Signed)
Your chest x-ray looked normal. Thanks, WS.

## 2021-08-31 ENCOUNTER — Other Ambulatory Visit: Payer: Self-pay | Admitting: Family Medicine

## 2021-08-31 DIAGNOSIS — R972 Elevated prostate specific antigen [PSA]: Secondary | ICD-10-CM

## 2021-09-01 LAB — OSMOLALITY: Osmolality Meas: 270 mOsmol/kg — ABNORMAL LOW (ref 280–301)

## 2021-09-01 LAB — SPECIMEN STATUS REPORT

## 2021-09-03 NOTE — Progress Notes (Signed)
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 Wyatt Portela.  Patient states that in the late May 2023 he started experiencing tightness and heaviness in his lower extremities, night sweats and fatigue.  He states in early June 2023 he noticed swelling and pain in his bilateral ankles.  He states he started wearing compression stockings.  In mid June he had vision loss in his left eye for about 3 to 5 minutes since then he had about 25-30 episodes of vision loss in his left eye till June 30th 2023.  He also had an episode of right eye double vision lasting for about a week.  He went to see Dr. Katy Fitch on August 05, 2021.  At that time Dr. Carolynn Sayers suspected temporal arteritis and obtained lab work which showed elevated sed rate and CRP.  He was also sent for temporal artery biopsy and was placed on prednisone 100 mg p.o. daily for 10 days and then tapered by 20 mg every 7 days.  He has been on prednisone 20 mg p.o. daily for the last 5 days.  Patient states after the first dose of prednisone 6 hours later he noticed improvement in his symptoms.  He did not have any headaches prior to starting prednisone but 3 days after starting prednisone he had left temporal pain which resolved after 3 hours.  Patient has been having episodes of transient vision loss for the last 1 month.  He had extensive workup including CT angio head and neck, ,MRI of the brain and carotid Dopplers which were negative .  Dr. Katy Fitch scheduled for temporal artery biopsy.  Temporal artery biopsy was performed on August 11, 2021 by Dr. Virl Cagey.  The left temporal artery biopsy was negative for temporal  arteritis.  He continues to experience some fatigue.  He denies any muscle pain or muscle weakness.  There is there are no episodes of vision loss or headaches.  There is no family history of autoimmune disease.  Activities of Daily Living:  Patient reports morning stiffness for 0 minutes.   Patient Denies nocturnal pain.  Difficulty dressing/grooming: Denies Difficulty climbing stairs: Denies Difficulty getting out of chair: Denies Difficulty using hands for taps, buttons, cutlery, and/or writing: Denies  Review of Systems  Constitutional:  Positive for fatigue.  HENT:  Negative for mouth sores and mouth dryness.   Eyes:  Negative for dryness.  Respiratory:  Negative for shortness of breath.   Cardiovascular:  Negative for chest pain and palpitations.  Gastrointestinal:  Negative for blood in stool, constipation and diarrhea.  Endocrine: Negative for increased urination.  Genitourinary:  Negative for involuntary urination.  Musculoskeletal:  Negative for joint pain, gait problem, joint pain, joint swelling, myalgias, muscle weakness, morning stiffness, muscle tenderness and myalgias.  Skin:  Positive for sensitivity to sunlight. Negative for color change, rash and hair loss.  Allergic/Immunologic: Negative for susceptible to infections.  Neurological:  Negative for dizziness and headaches.  Hematological:  Negative for swollen glands.  Psychiatric/Behavioral:  Positive for sleep disturbance.  Negative for depressed mood. The patient is not nervous/anxious.     PMFS History:  Patient Active Problem List   Diagnosis Date Noted   Elevated PSA 08/31/2021   Amaurosis fugax, left eye 07/13/2021   Personal history of colonic polyps - adenoma 08/28/2013   Seasonal allergies 06/12/2013   Obstructive sleep apnea 06/12/2013    Past Medical History:  Diagnosis Date   Allergy    Complication of anesthesia    Environmental allergies    Obstructive sleep apnea    Personal history of  colonic polyps - adenoma 08/28/2013    Family History  Problem Relation Age of Onset   Diabetes Mother    Healthy Daughter    Healthy Son    Colon cancer Neg Hx    Pancreatic cancer Neg Hx    Rectal cancer Neg Hx    Stomach cancer Neg Hx    Past Surgical History:  Procedure Laterality Date   ARTERY BIOPSY Left 08/11/2021   Procedure: TEMPORAL ARTERY BIOPSY;  Surgeon: Broadus John, MD;  Location: [facility];  Service: Vascular;  Laterality: Left;   FRACTURE SURGERY     left wrist   NASAL SEPTUM SURGERY  1987   WRIST SURGERY Left    Social History   Social History Narrative   Married x 42 years.    1 grandchildren   Immunization History  Administered Date(s) Administered   Hepatitis B 07/15/1999, 08/21/1999, 02/08/2000   MMR 08/11/1999   Tdap 10/10/2019     Objective: Vital Signs: BP 129/74 (BP Location: Right Arm, Patient Position: Sitting, Cuff Size: Normal)   Pulse 72   Resp 16   Ht 5' 7.5" (1.715 m)   Wt 156 lb 6.4 oz (70.9 kg)   BMI 24.13 kg/m    Physical Exam Vitals and nursing note reviewed.  Constitutional:      Appearance: He is well-developed.  HENT:     Head: Normocephalic and atraumatic.  Eyes:     Conjunctiva/sclera: Conjunctivae normal.     Pupils: Pupils are equal, round, and reactive to light.  Cardiovascular:     Rate and Rhythm: Normal rate and regular rhythm.     Heart sounds: Normal heart sounds.  Pulmonary:     Effort: Pulmonary effort is normal.     Breath sounds: Normal breath sounds.  Abdominal:     General: Bowel sounds are normal.     Palpations: Abdomen is soft.  Musculoskeletal:     Cervical back: Normal range of motion and neck supple.     Right lower leg: Edema present.     Left lower leg: Edema present.     Comments: Mild pitting edema was noted over bilateral lower extremities.  No synovitis was noted.  Skin:    General: Skin is warm and dry.     Capillary Refill: Capillary refill takes less than 2 seconds.   Neurological:     Mental Status: He is alert and oriented to person, place, and time.     Comments: There was no temporal artery tenderness.  Psychiatric:        Behavior: Behavior normal.      Musculoskeletal Exam: C-spine thoracic and lumbar spine were in good range of motion.  Shoulder joints, elbow joints, wrist joints, MCPs PIPs and DIPs with good range of motion.  He had bilateral PIP and DIP thickening with no synovitis.  Hip joints and knee joints were in good range of motion.  Genu valgus was noted.  There was no tenderness over ankles or MTPs.  Synovitis was noted.  He had no difficulty getting up from the chair.  He had some difficulty getting up from the squatting position.  Upper extremity muscle strength was normal.  CDAI Exam: CDAI Score: -- Patient Global: --; Provider Global: -- Swollen: --; Tender: -- Joint Exam 09/04/2021   No joint exam has been documented for this visit   There is currently no information documented on the homunculus. Go to the Rheumatology activity and complete the homunculus joint exam.  Investigation: No additional findings.  Imaging: DG Chest 2 View  Result Date: 08/25/2021 CLINICAL DATA:  Cough.  Shortness of breath.  COPD. EXAM: CHEST - 2 VIEW COMPARISON:  June 30, 2017 FINDINGS: The heart size and mediastinal contours are within normal limits. Both lungs are clear. The visualized skeletal structures are unremarkable. IMPRESSION: No active cardiopulmonary disease. Electronically Signed   By: Dorise Bullion III M.D.   On: 08/25/2021 17:52    Recent Labs: Lab Results  Component Value Date   WBC 12.4 (H) 08/25/2021   HGB 14.5 08/25/2021   PLT 267 08/25/2021   NA 133 (L) 08/25/2021   K 4.6 08/25/2021   CL 97 08/25/2021   CO2 23 08/25/2021   GLUCOSE 110 (H) 08/25/2021   BUN 14 08/25/2021   CREATININE 0.72 (L) 08/25/2021   BILITOT 0.9 08/25/2021   ALKPHOS 90 08/25/2021   AST 18 08/25/2021   ALT 35 08/25/2021   PROT 6.4 08/25/2021    ALBUMIN 4.2 08/25/2021   CALCIUM 9.1 08/25/2021   GFRAA 103 10/31/2018    July 16, 2021 LDL 138, CRP 14.9, ESR 82 July 29, 2021 ANA negative, RF 19.1, anti-CCP negative, ESR 30, RPR nonreactive, HIV nonreactive August 25, 2021 vitamin D27.3  Speciality Comments: No specialty comments available.  Procedures:  No procedures performed Allergies: Amoxicillin   Assessment / Plan:     Visit Diagnoses: Temporal arteritis ([place]) -patient was diagnosed with temporal arteritis by Dr. Wyatt Portela.  He developed several episodes of left eye vision loss and diplopia in his right eye.  His sed rate and CRP was elevated.  His symptoms improved in 6 hours on prednisone once and sed rate normalized.  He also gives history of lower extremity heaviness and bilateral ankle joint swelling at the same time.  He denies any history of muscular weakness or difficulty getting up from the chair or raising his arms .  CTA for head and neck was negative, MRI of brain was negative.  He was placed on prednisone 100 mg p.o. daily by Dr. Katy Fitch which he took for 10 days.  Since then he has tapered prednisone by 20 mg every week.  He has been on 20 mg of prednisone for the last 5 days.  Detailed counsel regarding temporal arteritis was provided.  Treatment options were discussed.  A gradual taper of prednisone was discussed.  I will increase the prednisone dose to 35 mg p.o. daily until he starts Actemra.  Once Actemra is started we will decrease prednisone by 5 mg every week until he reaches 20 mg p.o. daily.  Then we will follow the prednisone protocol for patients on Actemra with temporal arteritis.  Indications, side effects and contraindications of Actemra were discussed at length.  A handout was given and consent was taken.  We will apply for Actemra.  We will obtain labs in 1 month after starting Actemra and then every 3 months.  He will also need  a lipid panel 2 months after starting Actemra.  I will schedule MRA thoracic to rule  out vasculitis.-I will also obtain pan ANCA today.  Plan: Pan-ANCA  Medication Counseling:   Lab Results  Component Value Date   HIV Non Reactive 07/29/2021   HIV Non Reactive 01/23/2016      Lipid Panel Lab Results  Component Value Date   CHOL 214 (H) 08/25/2021   HDL 72 08/25/2021   LDLCALC 125 (H) 08/25/2021   TRIG 96 08/25/2021   CHOLHDL 3.0 08/25/2021     Chest x-ray: June 30, 2017 normal  Counseled patient that Actemra is an IL-6 blocking agent.  Reviewed Actemra dose of 4 mg/kg every 4 weeks.  Counseled patient on purpose, proper use, and adverse effects of Actemra.  Reviewed the most common adverse effects including infections, injection site reaction, bowel injury, and rarely cancer and conditions of the nervous system.  Reviewed that the medication should be held during infections.  Discussed that there is the possibility of an increased risk of malignancy but it is not well understood if this increased risk is due to the medication or the disease state.  Counseled patient that Actemra should be held prior to scheduled surgery.  Counseled patient to avoid live vaccines while on Actemra.  Recommend patient to get annual influenza vaccine, pneumococcal vaccine and Shingrix as indicated.   Reviewed the importance of regular labs while on Actemra therapy including the need for routine lipid panel. Standing orders placed.  Provided patient with medication education material and answered all questions.  Patient voiced understanding.  Patient consented to Actemra.  Will upload consent into the media tab.  Will submit benefits investigation for Actemra.     Amaurosis fugax, left eye  High risk medication use -before restarting immunosuppressive therapy I will obtain following labs today.  Plan: Hepatitis B core antibody, IgM, Hepatitis B surface antigen, Hepatitis C antibody, QuantiFERON-TB Gold Plus, Serum protein electrophoresis with reflex, IgG, IgA, IgM.  He will get labs in a  month and then every 3 months.  He will also need repeat lipid panel in 2 months.  Information regarding immunization was placed in the AVS.  He was advised to hold Actemra if he develops an infection and resume after the infection resolves.  Long term (current) use of systemic steroids-he will be on long-term prednisone.  The prednisone taper will be expedited with the use of Actemra.  Other fatigue -he has been experiencing increased fatigue.  Plan: CK  Osteoporosis screening-will be on long term prednisone.  Vitamin D deficiency- He was advised to take vitamin D 2000 units daily.  Personal history of colonic polyps - adenoma  Seasonal allergies  Obstructive sleep apnea - on CPAP  Elevated PSA  Orders: Orders Placed This Encounter  Procedures   DG Bone Density   MR ANGIO CHEST WO CONTRAST   CK   Hepatitis B core antibody, IgM   Hepatitis B surface antigen   Hepatitis C antibody   QuantiFERON-TB Gold Plus   Serum protein electrophoresis with reflex   IgG, IgA, IgM   Pan-ANCA   Meds ordered this encounter  Medications   predniSONE (DELTASONE) 10 MG tablet    Sig: Take 3.5 tablets po qd until starting actemra, then will follow actemra prednisone trial.    Dispense:  105 tablet    Refill:  0    Face-to-face time spent with patient was 50 minutes. Greater than 50% of time was spent in counseling and coordination  of care.  Follow-Up Instructions: Return for Temporal arteritis.   Bo Merino, MD  Note - This record has been created using Editor, commissioning.  Chart creation errors have been sought, but may not always  have been located. Such creation errors do not reflect on  the standard of medical care.

## 2021-09-04 ENCOUNTER — Encounter: Payer: Self-pay | Admitting: Rheumatology

## 2021-09-04 ENCOUNTER — Ambulatory Visit: Payer: Medicare HMO | Attending: Rheumatology | Admitting: Rheumatology

## 2021-09-04 VITALS — BP 129/74 | HR 72 | Resp 16 | Ht 67.5 in | Wt 156.4 lb

## 2021-09-04 DIAGNOSIS — J302 Other seasonal allergic rhinitis: Secondary | ICD-10-CM | POA: Diagnosis not present

## 2021-09-04 DIAGNOSIS — Z86010 Personal history of colonic polyps: Secondary | ICD-10-CM

## 2021-09-04 DIAGNOSIS — Z7952 Long term (current) use of systemic steroids: Secondary | ICD-10-CM

## 2021-09-04 DIAGNOSIS — G453 Amaurosis fugax: Secondary | ICD-10-CM

## 2021-09-04 DIAGNOSIS — G4733 Obstructive sleep apnea (adult) (pediatric): Secondary | ICD-10-CM

## 2021-09-04 DIAGNOSIS — M316 Other giant cell arteritis: Secondary | ICD-10-CM

## 2021-09-04 DIAGNOSIS — R5383 Other fatigue: Secondary | ICD-10-CM

## 2021-09-04 DIAGNOSIS — E559 Vitamin D deficiency, unspecified: Secondary | ICD-10-CM

## 2021-09-04 DIAGNOSIS — Z79899 Other long term (current) drug therapy: Secondary | ICD-10-CM

## 2021-09-04 DIAGNOSIS — Z13820 Encounter for screening for osteoporosis: Secondary | ICD-10-CM | POA: Diagnosis not present

## 2021-09-04 DIAGNOSIS — R972 Elevated prostate specific antigen [PSA]: Secondary | ICD-10-CM | POA: Diagnosis not present

## 2021-09-04 MED ORDER — PREDNISONE 10 MG PO TABS: ORAL_TABLET | ORAL | 0 refills | Status: DC

## 2021-09-04 NOTE — Patient Instructions (Addendum)
Prednisone:  take '35mg'$  by mouth daily until starting Actemra. Then after starting actemra, you will taper prednisone per the Actemra prednisone trial. We will send in a new prednisone prescription after you start the actemra.    Tocilizumab Injection What is this medication? TOCILIZUMAB (TOE si LIZ ue mab) treats autoimmune conditions, such as arthritis. It works by slowing down an overactive immune system. It may also be used to treat severe COVID-19 in people who are hospitalized. It is a monoclonal antibody. This medicine may be used for other purposes; ask your health care provider or pharmacist if you have questions. COMMON BRAND NAME(S): Actemra What should I tell my care team before I take this medication? They need to know if you have any of these conditions: Cancer Diabetes Heart disease History of or current hepatitis B infection High blood pressure High cholesterol Immune system problems Infection Liver disease Low blood counts, such as low white cell, platelet, or red cell counts Multiple sclerosis Recent or upcoming vaccine Stomach or intestine problems Stroke An unusual or allergic reaction to tocilizumab, other medications, foods, dyes, or preservatives Pregnant or trying to get pregnant Breast-feeding How should I use this medication? This medication is injected into a vein or under the skin. It may be given by your care team in a hospital or clinic setting. It may also be given at home. If you get this medication at home, you will be taught how to prepare and give it. Use it exactly as directed. Take it as directed on the prescription label. Keep taking it unless your care team tells you to stop. If you use a pen, be sure to take off the outer needle cover before using the dose. It is important that you put your used needles and syringes in a special sharps container. Do not put them in a trash can. If you do not have a sharps container, call your pharmacist or care  team to get one. A special MedGuide will be given to you by the pharmacist with each prescription and refill. Be sure to read this information carefully each time. Talk to your care team about the use of this medication in children. While it may be prescribed for children as young as 2 years for selected conditions, precautions do apply. Overdosage: If you think you have taken too much of this medicine contact a poison control center or emergency room at once. NOTE: This medicine is only for you. Do not share this medicine with others. What if I miss a dose? If you get this medication at the hospital or clinic: It is important not to miss your dose. Call your care team if you are unable to keep an appointment. If you give yourself this medication at home: If you miss a dose, take it as soon as you can. If it is almost time for your next dose, take only that dose. Do not take double or extra doses. Call your care team with questions. What may interact with this medication? Do not take this medication with any of the following: Live virus vaccines This medication may also interact with the following: Biologic medications, such as abatacept, adalimumab, anakinra, certolizumab, etanercept, golimumab, infliximab, rituximab, secukinumab, ustekinumab Certain medications for cholesterol, such as atorvastatin, lovastatin, simvastatin Cyclosporine Estrogen or progestin hormones Omeprazole Steroid medications, such as prednisone or cortisone Theophylline Vaccines Warfarin This list may not describe all possible interactions. Give your health care provider a list of all the medicines, herbs, non-prescription drugs, or dietary  supplements you use. Also tell them if you smoke, drink alcohol, or use illegal drugs. Some items may interact with your medicine. What should I watch for while using this medication? Visit your care team for regular checks on your progress. Your condition will be monitored carefully  while you are receiving this medication. Tell your care team if your symptoms do not start to get better or if they get worse. You may need blood work done while taking this medication. You will be tested for tuberculosis (TB) before you start this medication. If your care team prescribes any medication for TB, you should start taking the TB medication before starting this medication. Make sure to finish the full course of TB medication. This medication may increase your risk of getting an infection. Call your care team for advice if you get a fever, chills, sore throat, or other symptoms of a cold or flu. Do not treat yourself. Try to avoid being around people who are sick. Talk to your care team about your risk of cancer. You may be more at risk for certain types of cancers if you take this medication. What side effects may I notice from receiving this medication? Side effects that you should report to your care team as soon as possible: Allergic reactions--skin rash, itching, hives, swelling of the face, lips, tongue, or throat Infection--fever, chills, cough, sore throat, wounds that don't heal, pain or trouble when passing urine, general feeling of discomfort or being unwell Liver injury--right upper belly pain, loss of appetite, nausea, light-colored stool, dark yellow or brown urine, yellowing skin or eyes, unusual weakness or fatigue Stomach pain that is severe, does not go away, or gets worse Unusual bruising or bleeding Side effects that usually do not require medical attention (report to your care team if they continue or are bothersome): Dizziness Headache Increase in blood pressure Pain, redness, or irritation at injection site Runny or stuffy nose Sore throat Stomach pain This list may not describe all possible side effects. Call your doctor for medical advice about side effects. You may report side effects to FDA at 1-800-FDA-1088. Where should I keep my medication? Keep out of  the reach of children and pets. You will be instructed on how to store this medication. Get rid of any unused medication after the expiration date on the label. To get rid of medications that are no longer needed or have expired: Take the medication to a medication take-back program. Check with your pharmacy or law enforcement to find a location. If you cannot return the medication, ask your pharmacist or care team how to get rid of this medication safely. NOTE: This sheet is a summary. It may not cover all possible information. If you have questions about this medicine, talk to your doctor, pharmacist, or health care provider.  2023 Elsevier/Gold Standard (2021-03-26 00:00:00)  Standing Labs We placed an order today for your standing lab work.   Please have your standing labs drawn in a month after starting Actemra and then every 3 months  If possible, please have your labs drawn 2 weeks prior to your appointment so that the provider can discuss your results at your appointment.  Please note that you may see your imaging and lab results in [facility] before we have reviewed them. We may be awaiting multiple results to interpret others before contacting you. Please allow our office up to 72 hours to thoroughly review all of the results before contacting the office for clarification of your results.  We have open lab daily: Monday through Thursday from 1:30-4:30 PM and Friday from 1:30-4:00 PM at the office of Dr. Bo Merino, [facility].   Please be advised, all patients with office appointments requiring lab work will take precedent over walk-in lab work.  If possible, please come for your lab work on Monday and Friday afternoons, as you may experience shorter wait times. The office is located at [address], [place], [place], [place] [address] No appointment is necessary.   Labs are drawn by [facility]. Please bring your co-pay at the time of your lab draw.  You may  receive a bill from [facility] for your lab work.  Please note if you are on Hydroxychloroquine and and an order has been placed for a Hydroxychloroquine level, you will need to have it drawn 4 hours or more after your last dose.  If you wish to have your labs drawn at another location, please call the office 24 hours in advance to send orders.  If you have any questions regarding directions or hours of operation,  please call [phone].   As a reminder, please drink plenty of water prior to coming for your lab work. Thanks!   Vaccines You are taking a medication(s) that can suppress your immune system.  The following immunizations are recommended: Flu annually Covid-19  Td/Tdap (tetanus, diphtheria, pertussis) every 10 years Pneumonia (Prevnar 15 then Pneumovax 23 at least 1 year apart.  Alternatively, can take Prevnar 20 without needing additional dose) Shingrix: 2 doses from 4 weeks to 6 months apart  Please check with your PCP to make sure you are up to date.   If you have signs or symptoms of an infection or start antibiotics: First, call your PCP for workup of your infection. Hold your medication through the infection, until you complete your antibiotics, and until symptoms resolve if you take the following: Injectable medication (Actemra, Benlysta, Cimzia, Cosentyx, Enbrel, Humira, Kevzara, Orencia, Remicade, Simponi, Stelara, Taltz, Tremfya) Methotrexate Leflunomide (Arava) Mycophenolate (Cellcept) Morrie Sheldon, Olumiant, or Rinvoq

## 2021-09-07 ENCOUNTER — Other Ambulatory Visit (HOSPITAL_COMMUNITY): Payer: Self-pay

## 2021-09-09 ENCOUNTER — Telehealth: Payer: Self-pay | Admitting: Pharmacist

## 2021-09-09 ENCOUNTER — Telehealth: Payer: Self-pay | Admitting: *Deleted

## 2021-09-09 NOTE — Telephone Encounter (Addendum)
Submitted a Prior Authorization request to [facility] for ACTEMRA via CoverMyMeds. Will update once we receive a response.  Dose: '162mg'$  SQ every 7 days  Key: [id number]  Per most recent phone note from today, patient wants to wait on opinion of natural doctor prior to starting medications (including prednisone)  Knox Saliva, PharmD, MPH, BCPS, CPP Clinical Pharmacist (Rheumatology and Pulmonology)  ----- Message from [place] sent at 09/04/2021  9:24 AM EDT ----- Please apply for Actemra (subcu) per Dr. Estanislado Pandy. Thanks!  Consent obtained and send to the scan center.

## 2021-09-09 NOTE — Telephone Encounter (Signed)
Patient returned call.  I had a detailed discussion with the patient.  He stated that he reduced the prednisone to 10 mg p.o. daily.  I advised him that decreasing prednisone rapidly can cause recurrence of arteritis and can cause vision loss.  He stated that he wants to try ionized water which was recommended by his daughter .  I advised that these methods are not effective in the treatment of temporal arteritis.  He stated that he understands the risk of not taking prednisone but he would like to stay on low-dose prednisone.  I advised him to stay on  a minimum of prednisone 20 mg p.o. daily until his follow-up visit as he does not want to increase the prednisone to 35 mg p.o. daily.  He was in agreement.  He understands that he should go to the emergency room if the has vision loss or any visual changes.

## 2021-09-09 NOTE — Telephone Encounter (Signed)
I left message on the answering machine for patient to call back.  It will be okay if he does not want to do DEXA scan and CT scan but he should stay on prednisone, otherwise he can lose his vision permanently.

## 2021-09-09 NOTE — Telephone Encounter (Signed)
FYI - Patient is refusing DEXA, CT and prednisone until he receives recommendation from a natural doctor regarding care.

## 2021-09-09 NOTE — Telephone Encounter (Signed)
Patient advised it will be okay if he does not want to do DEXA scan and CT scan but he should stay on prednisone, otherwise he can lose his vision permanently. Patient expressed understanding and states he plans on staying on lose does prednisone.

## 2021-09-10 ENCOUNTER — Ambulatory Visit (INDEPENDENT_AMBULATORY_CARE_PROVIDER_SITE_OTHER): Payer: Medicare HMO | Admitting: Family Medicine

## 2021-09-10 ENCOUNTER — Encounter: Payer: Self-pay | Admitting: Family Medicine

## 2021-09-10 VITALS — BP 154/74 | HR 85 | Temp 98.9°F | Ht 67.5 in | Wt 157.0 lb

## 2021-09-10 DIAGNOSIS — M316 Other giant cell arteritis: Secondary | ICD-10-CM | POA: Diagnosis not present

## 2021-09-10 DIAGNOSIS — E559 Vitamin D deficiency, unspecified: Secondary | ICD-10-CM | POA: Diagnosis not present

## 2021-09-10 LAB — PAN-ANCA
ANCA SCREEN: NEGATIVE
Myeloperoxidase Abs: <1 AI (ref <1.0)
Serine Protease 3: <1 AI (ref <1.0)

## 2021-09-10 LAB — HEPATITIS B CORE ANTIBODY, IGM: Hep B C IgM: NONREACTIVE

## 2021-09-10 LAB — PROTEIN ELECTROPHORESIS, SERUM, WITH REFLEX
Albumin ELP: 3.5 g/dL — ABNORMAL LOW (ref 3.8–4.8)
Alpha 1: 0.3 g/dL (ref 0.2–0.3)
Alpha 2: 0.7 g/dL (ref 0.5–0.9)
Beta 2: 0.3 g/dL (ref 0.2–0.5)
Beta Globulin: 0.4 g/dL (ref 0.4–0.6)
Gamma Globulin: 0.7 g/dL — ABNORMAL LOW (ref 0.8–1.7)
Total Protein: 5.8 g/dL — ABNORMAL LOW (ref 6.1–8.1)

## 2021-09-10 LAB — QUANTIFERON-TB GOLD PLUS
Mitogen-NIL: 4.57 IU/mL
NIL: 0.1 IU/mL
QuantiFERON-TB Gold Plus: NEGATIVE
TB1-NIL: 0.03 IU/mL
TB2-NIL: 0.03 IU/mL

## 2021-09-10 LAB — CK: Total CK: 34 U/L — ABNORMAL LOW (ref 44–196)

## 2021-09-10 LAB — HEPATITIS C ANTIBODY: Hepatitis C Ab: NONREACTIVE

## 2021-09-10 LAB — IFE INTERPRETATION: Immunofix Electr Int: NOT DETECTED

## 2021-09-10 LAB — IGG, IGA, IGM
IgG (Immunoglobin G), Serum: 707 mg/dL (ref 600–1540)
IgM, Serum: 70 mg/dL (ref 50–300)
Immunoglobulin A: 133 mg/dL (ref 70–320)

## 2021-09-10 LAB — HEPATITIS B SURFACE ANTIGEN: Hepatitis B Surface Ag: NONREACTIVE

## 2021-09-10 MED ORDER — PREDNISONE 20 MG PO TABS: 20.0000 mg | ORAL_TABLET | Freq: Every day | ORAL | 1 refills | Status: DC

## 2021-09-10 MED ORDER — VITAMIN D (ERGOCALCIFEROL) 1.25 MG (50000 UNIT) PO CAPS: 50000.0000 [IU] | ORAL_CAPSULE | ORAL | 3 refills | Status: AC

## 2021-09-10 NOTE — Progress Notes (Signed)
Subjective:  Patient ID: Michael Joseph, male    DOB: December 20, 1950  Age: 71 y.o. MRN: [id number]  CC: Follow-up   HPI KELSON QUEENAN presents for recheck of temporal arteritis. Decided againt MCA tx. Wants to try an essential oil tx. She did talk him into continuing prednisone.     09/10/2021   10:04 AM 08/25/2021   11:12 AM 07/29/2021    2:46 PM  Depression screen PHQ 2/9  Decreased Interest 0 0 0  Down, Depressed, Hopeless 0 0 0  PHQ - 2 Score 0 0 0    History Javaris has a past medical history of Allergy, Complication of anesthesia, Environmental allergies, Obstructive sleep apnea, and Personal history of colonic polyps - adenoma (08/28/2013).   He has a past surgical history that includes Wrist surgery (Left); Nasal septum surgery (1987); Fracture surgery; and Artery Biopsy (Left, 08/11/2021).   His family history includes Diabetes in his mother; Healthy in his daughter and son.He reports that he quit smoking about 18 years ago. His smoking use included pipe. He has never been exposed to tobacco smoke. He has never used smokeless tobacco. He reports current alcohol use of about 2.0 standard drinks of alcohol per week. He reports that he does not use drugs.    ROS Review of Systems  Objective:  BP (!) 154/74   Pulse 85   Temp 98.9 F (37.2 C)   Ht 5' 7.5" (1.715 m)   Wt 157 lb (71.2 kg)   SpO2 97%   BMI 24.23 kg/m   BP Readings from Last 3 Encounters:  09/10/21 (!) 154/74  09/04/21 129/74  08/25/21 127/74    Wt Readings from Last 3 Encounters:  09/10/21 157 lb (71.2 kg)  09/04/21 156 lb 6.4 oz (70.9 kg)  08/25/21 150 lb 3.2 oz (68.1 kg)     Physical Exam Vitals reviewed.  Constitutional:      Appearance: He is well-developed.  HENT:     Head: Normocephalic and atraumatic.     Right Ear: External ear normal.     Left Ear: External ear normal.     Mouth/Throat:     Pharynx: No oropharyngeal exudate or posterior oropharyngeal erythema.  Eyes:      Pupils: Pupils are equal, round, and reactive to light.  Cardiovascular:     Rate and Rhythm: Normal rate and regular rhythm.     Heart sounds: No murmur heard. Pulmonary:     Effort: No respiratory distress.     Breath sounds: Normal breath sounds.  Musculoskeletal:        General: No tenderness (at temples for percussion).     Cervical back: Normal range of motion and neck supple.  Neurological:     Mental Status: He is alert and oriented to person, place, and time.       Assessment & Plan:   Engelbert was seen today for follow-up.  Diagnoses and all orders for this visit:  Vitamin D deficiency  Temporal arteritis ([place]) Comments: MRA thoracic Orders: -     predniSONE (DELTASONE) 20 MG tablet; Take 1 tablet (20 mg total) by mouth daily with breakfast.  Other orders -     Vitamin D, Ergocalciferol, (DRISDOL) 1.25 MG (50000 UNIT) CAPS capsule; Take 1 capsule (50,000 Units total) by mouth every 7 (seven) days.       I have changed Quirino G. Hams's predniSONE. I am also having him start on Vitamin D (Ergocalciferol). Additionally, I am having him maintain his  albuterol, fluticasone, fexofenadine-pseudoephedrine, finasteride, multivitamin with minerals, Ascorbic Acid (VITAMIN C PO), docusate sodium, traMADol, and tamsulosin.  Allergies as of 09/10/2021       Reactions   Amoxicillin Rash   Skin rash        Medication List        Accurate as of September 10, 2021 11:59 PM. If you have any questions, ask your nurse or doctor.          albuterol 108 (90 Base) MCG/ACT inhaler Commonly known as: ProAir HFA Inhale 2 puffs into the lungs every 6 (six) hours as needed for wheezing or shortness of breath. 30-45 min prior to exercise Need to be seen   docusate sodium 100 MG capsule Commonly known as: COLACE Take 100 mg by mouth in the morning.   fexofenadine-pseudoephedrine 180-240 MG 24 hr tablet Commonly known as: ALLEGRA-D 24 Take 1 tablet by mouth every evening.  For allergy and congestion What changed:  when to take this reasons to take this additional instructions   finasteride 5 MG tablet Commonly known as: PROSCAR TAKE 1 TABLET DAILY   fluticasone 50 MCG/ACT nasal spray Commonly known as: FLONASE Place 2 sprays into both nostrils daily. What changed: when to take this   multivitamin with minerals Tabs tablet Take 1 tablet by mouth in the morning.   predniSONE 20 MG tablet Commonly known as: DELTASONE Take 1 tablet (20 mg total) by mouth daily with breakfast. What changed:  medication strength how much to take how to take this when to take this additional instructions Changed by: Claretta Fraise, MD   tamsulosin 0.4 MG Caps capsule Commonly known as: FLOMAX TAKE 2 CAPSULES AT BEDTIME FOR URINE FLOW AND PROSTATE   traMADol 50 MG tablet Commonly known as: Ultram Take 1 tablet (50 mg total) by mouth every 6 (six) hours as needed for moderate pain.   VITAMIN C PO Take 1 tablet by mouth daily.   Vitamin D (Ergocalciferol) 1.25 MG (50000 UNIT) Caps capsule Commonly known as: DRISDOL Take 1 capsule (50,000 Units total) by mouth every 7 (seven) days. Started by: Claretta Fraise, MD         Follow-up: Return in about 6 months (around 03/13/2022), or if symptoms worsen or fail to improve. Sooner with rheumatology as agreed between pt. And MD  Claretta Fraise, M.D.

## 2021-09-11 ENCOUNTER — Other Ambulatory Visit (HOSPITAL_COMMUNITY): Payer: Self-pay

## 2021-09-11 NOTE — Progress Notes (Signed)
I will discuss results at the follow-up visit.

## 2021-09-11 NOTE — Telephone Encounter (Signed)
Received notification from [facility] regarding a prior authorization for Michael Joseph. Authorization has been APPROVED from 01/25/21 to 01/24/22.   Per test claim, copay for 28 days supply is $242.95  Patient can fill through [place]: [phone]   Patient and provider form for [facility] patient assistance application printed.  Knox Saliva, PharmD, MPH, BCPS, CPP Clinical Pharmacist (Rheumatology and Pulmonology)

## 2021-09-14 NOTE — Telephone Encounter (Signed)
I reviewed telephone note from Dr. Estanislado Pandy - Actemra was not discussed.  Patient states he has agreed to stay on prednisone '20mg'$  daily but does not want to add on Actemra at this time. He would like to try his alternative therapy for a few months prior to adding on medication. He states the prednisone does help him with his symptoms but will reach out if his current preferred treatment strategy leads to worsening symptoms.  We discussed that Actemra is added on for temporal arteritis to reduce steroid burden and exposure. He verbalized understanding. Approval through insurance in place however patient will need to complete patient assistance paperwork if he decides to move forward with it in future.  Closing encounter for now.  Knox Saliva, PharmD, MPH, BCPS, CPP Clinical Pharmacist (Rheumatology and Pulmonology)

## 2021-09-16 ENCOUNTER — Encounter: Payer: Self-pay | Admitting: Urology

## 2021-09-16 ENCOUNTER — Ambulatory Visit: Payer: Medicare HMO | Admitting: Urology

## 2021-09-16 VITALS — BP 152/78 | HR 96 | Ht 67.0 in | Wt 157.0 lb

## 2021-09-16 DIAGNOSIS — N401 Enlarged prostate with lower urinary tract symptoms: Secondary | ICD-10-CM

## 2021-09-16 DIAGNOSIS — R339 Retention of urine, unspecified: Secondary | ICD-10-CM | POA: Insufficient documentation

## 2021-09-16 DIAGNOSIS — R972 Elevated prostate specific antigen [PSA]: Secondary | ICD-10-CM

## 2021-09-16 DIAGNOSIS — N138 Other obstructive and reflux uropathy: Secondary | ICD-10-CM | POA: Diagnosis not present

## 2021-09-16 LAB — BLADDER SCAN AMB NON-IMAGING: Scan Result: 154

## 2021-09-16 MED ORDER — MIRABEGRON ER 25 MG PO TB24: 25.0000 mg | ORAL_TABLET | Freq: Every day | ORAL | 0 refills | Status: DC

## 2021-09-16 NOTE — Progress Notes (Signed)
Assessment: 1. Elevated PSA   2. BPH with obstruction/lower urinary tract symptoms     Plan: I reviewed the patient's records including available PSA results. His most recent PSA was normal.  I doubt this is due to his resuming the finasteride 3 months ago. Recommend continuing tamsulosin and finasteride. We will repeat PSA in 3 months. Trial of Myrbetriq 25 mg daily.  Samples provided. Return to office in 1 month.  Chief Complaint:  Chief Complaint  Patient presents with   Elevated PSA    History of Present Illness:  Michael Joseph is a 71 y.o. male who is seen in consultation from Claretta Fraise, MD for evaluation of elevated PSA. PSA results: 12/17 3.2 10/20 4.8 6/23 7.7 8/23 2.5  He was started on tamsulosin and finasteride in 2021.  He reported taking this medicine for approximately 1 year then discontinuing.  He restarted both medications approximately 3 months ago.  He has symptoms of urinary frequency, urgency, nocturia x4, occasional urge incontinence, intermittent stream, and weak stream.  No dysuria or gross hematuria. IPSS = 21 today.  Past Medical History:  Past Medical History:  Diagnosis Date   Allergy    Complication of anesthesia    Environmental allergies    Obstructive sleep apnea    Personal history of colonic polyps - adenoma 08/28/2013    Past Surgical History:  Past Surgical History:  Procedure Laterality Date   ARTERY BIOPSY Left 08/11/2021   Procedure: TEMPORAL ARTERY BIOPSY;  Surgeon: Broadus John, MD;  Location: [facility];  Service: Vascular;  Laterality: Left;   FRACTURE SURGERY     left wrist   NASAL SEPTUM SURGERY  1987   WRIST SURGERY Left     Allergies:  Allergies  Allergen Reactions   Amoxicillin Rash    Skin rash    Family History:  Family History  Problem Relation Age of Onset   Diabetes Mother    Healthy Daughter    Healthy Son    Colon cancer Neg Hx    Pancreatic cancer Neg Hx    Rectal cancer Neg Hx     Stomach cancer Neg Hx     Social History:  Social History   Tobacco Use   Smoking status: Former    Types: Pipe    Quit date: 01/26/2003    Years since quitting: 18.6    Passive exposure: Never   Smokeless tobacco: Never  Vaping Use   Vaping Use: Never used  Substance Use Topics   Alcohol use: Yes    Alcohol/week: 2.0 standard drinks of alcohol    Types: 2 Cans of beer per week    Comment: occ   Drug use: No    Review of symptoms:  Constitutional:  Negative for unexplained weight loss, night sweats, fever, chills ENT:  Negative for nose bleeds, sinus pain, painful swallowing CV:  Negative for chest pain, shortness of breath, exercise intolerance, palpitations, loss of consciousness Resp:  Negative for cough, wheezing, shortness of breath GI:  Negative for nausea, vomiting, diarrhea, bloody stools GU:  Positives noted in HPI; otherwise negative for gross hematuria, dysuria Neuro:  Negative for seizures, poor balance, limb weakness, slurred speech Psych:  Negative for lack of energy, depression, anxiety Endocrine:  Negative for polydipsia, polyuria, symptoms of hypoglycemia (dizziness, hunger, sweating) Hematologic:  Negative for anemia, purpura, petechia, prolonged or excessive bleeding, use of anticoagulants  Allergic:  Negative for difficulty breathing or choking as a result of exposure to anything; no  shellfish allergy; no allergic response (rash/itch) to materials, foods  Physical exam: BP (!) 152/78   Pulse 96   Ht '5\' 7"'$  (1.702 m)   Wt 157 lb (71.2 kg)   BMI 24.59 kg/m  GENERAL APPEARANCE:  Well appearing, well developed, well nourished, NAD HEENT: Atraumatic, Normocephalic, oropharynx clear. NECK: Supple without lymphadenopathy or thyromegaly. LUNGS: Clear to auscultation bilaterally. HEART: Regular Rate and Rhythm without murmurs, gallops, or rubs. ABDOMEN: Soft, non-tender, No Masses. EXTREMITIES: Moves all extremities well.  Without clubbing, cyanosis, or  edema. NEUROLOGIC:  Alert and oriented x 3, normal gait, CN II-XII grossly intact.  MENTAL STATUS:  Appropriate. BACK:  Non-tender to palpation.  No CVAT SKIN:  Warm, dry and intact.   GU: Penis:  circumcised Meatus: Normal Scrotum: normal, no masses Testis: normal without masses bilateral Epididymis: normal Prostate: 60 g, NT, no nodules Rectum: Normal tone,  no masses or tenderness   Results: U/A: Dipstick negative  PVR:  154 ml   (patient voided after bladder scan)

## 2021-09-16 NOTE — Progress Notes (Signed)
post void residual =124m

## 2021-09-16 NOTE — Progress Notes (Deleted)
Office Visit Note  Patient: Michael Joseph             Date of Birth: 10-06-1950           MRN: [id number]             PCP: Claretta Fraise, MD Referring: Claretta Fraise, MD Visit Date: 09/30/2021 Occupation: _0 @  Subjective:  No chief complaint on file.   History of Present Illness: Michael Joseph is a 71 y.o. male ***   Activities of Daily Living:  Patient reports morning stiffness for *** {minute/hour:19697}.   Patient {ACTIONS;DENIES/REPORTS:21021675::"Denies"} nocturnal pain.  Difficulty dressing/grooming: {ACTIONS;DENIES/REPORTS:21021675::"Denies"} Difficulty climbing stairs: {ACTIONS;DENIES/REPORTS:21021675::"Denies"} Difficulty getting out of chair: {ACTIONS;DENIES/REPORTS:21021675::"Denies"} Difficulty using hands for taps, buttons, cutlery, and/or writing: {ACTIONS;DENIES/REPORTS:21021675::"Denies"}  No Rheumatology ROS completed.   PMFS History:  Patient Active Problem List   Diagnosis Date Noted   Incomplete bladder emptying 09/16/2021   BPH with obstruction/lower urinary tract symptoms 09/16/2021   Vitamin D deficiency 09/10/2021   Temporal arteritis ([place]) 09/10/2021   Elevated PSA 08/31/2021   Amaurosis fugax, left eye 07/13/2021   Personal history of colonic polyps - adenoma 08/28/2013   Seasonal allergies 06/12/2013   Obstructive sleep apnea 06/12/2013    Past Medical History:  Diagnosis Date   Allergy    Complication of anesthesia    Environmental allergies    Obstructive sleep apnea    Personal history of colonic polyps - adenoma 08/28/2013    Family History  Problem Relation Age of Onset   Diabetes Mother    Healthy Daughter    Healthy Son    Colon cancer Neg Hx    Pancreatic cancer Neg Hx    Rectal cancer Neg Hx    Stomach cancer Neg Hx    Past Surgical History:  Procedure Laterality Date   ARTERY BIOPSY Left 08/11/2021   Procedure: TEMPORAL ARTERY BIOPSY;  Surgeon: Broadus John, MD;  Location: [facility];  Service: Vascular;   Laterality: Left;   FRACTURE SURGERY     left wrist   NASAL SEPTUM SURGERY  1987   WRIST SURGERY Left    Social History   Social History Narrative   Married x 42 years.    1 grandchildren   Immunization History  Administered Date(s) Administered   Hepatitis B 07/15/1999, 08/21/1999, 02/08/2000   MMR 08/11/1999   Tdap 10/10/2019     Objective: Vital Signs: There were no vitals taken for this visit.   Physical Exam   Musculoskeletal Exam: ***  CDAI Exam: CDAI Score: -- Patient Global: --; Provider Global: -- Swollen: --; Tender: -- Joint Exam 09/30/2021   No joint exam has been documented for this visit   There is currently no information documented on the homunculus. Go to the Rheumatology activity and complete the homunculus joint exam.  Investigation: No additional findings.  Imaging: DG Chest 2 View  Result Date: 08/25/2021 CLINICAL DATA:  Cough.  Shortness of breath.  COPD. EXAM: CHEST - 2 VIEW COMPARISON:  June 30, 2017 FINDINGS: The heart size and mediastinal contours are within normal limits. Both lungs are clear. The visualized skeletal structures are unremarkable. IMPRESSION: No active cardiopulmonary disease. Electronically Signed   By: Dorise Bullion III M.D.   On: 08/25/2021 17:52    Recent Labs: Lab Results  Component Value Date   WBC 12.4 (H) 08/25/2021   HGB 14.5 08/25/2021   PLT 267 08/25/2021   NA 133 (L) 08/25/2021   K 4.6 08/25/2021   CL 97  08/25/2021   CO2 23 08/25/2021   GLUCOSE 110 (H) 08/25/2021   BUN 14 08/25/2021   CREATININE 0.72 (L) 08/25/2021   BILITOT 0.9 08/25/2021   ALKPHOS 90 08/25/2021   AST 18 08/25/2021   ALT 35 08/25/2021   PROT 5.8 (L) 09/04/2021   ALBUMIN 4.2 08/25/2021   CALCIUM 9.1 08/25/2021   GFRAA 103 10/31/2018   QFTBGOLDPLUS NEGATIVE 09/04/2021    September 04, 2021 IFE normal, TB Gold negative, immunoglobulins normal, ANCA negative, MPO negative, serine protease 3 negative, hepatitis B-, hepatitis C negative,  CK 34  Speciality Comments: No specialty comments available.  Procedures:  No procedures performed Allergies: Amoxicillin   Assessment / Plan:     Visit Diagnoses: No diagnosis found.  Orders: No orders of the defined types were placed in this encounter.  No orders of the defined types were placed in this encounter.   Face-to-face time spent with patient was *** minutes. Greater than 50% of time was spent in counseling and coordination of care.  Follow-Up Instructions: No follow-ups on file.   Bo Merino, MD  Note - This record has been created using Editor, commissioning.  Chart creation errors have been sought, but may not always  have been located. Such creation errors do not reflect on  the standard of medical care.

## 2021-09-17 LAB — MICROSCOPIC EXAMINATION
Bacteria, UA: NONE SEEN
Epithelial Cells (non renal): NONE SEEN /hpf (ref 0–10)
RBC, Urine: NONE SEEN /hpf (ref 0–2)
Renal Epithel, UA: NONE SEEN /hpf
WBC, UA: NONE SEEN /hpf (ref 0–5)

## 2021-09-17 LAB — URINALYSIS, ROUTINE W REFLEX MICROSCOPIC
Bilirubin, UA: NEGATIVE
Glucose, UA: NEGATIVE
Ketones, UA: NEGATIVE
Leukocytes,UA: NEGATIVE
Nitrite, UA: NEGATIVE
Protein,UA: NEGATIVE
Specific Gravity, UA: 1.02 (ref 1.005–1.030)
Urobilinogen, Ur: 0.2 mg/dL (ref 0.2–1.0)
pH, UA: 5.5 (ref 5.0–7.5)

## 2021-09-30 ENCOUNTER — Ambulatory Visit: Payer: Medicare HMO | Admitting: Rheumatology

## 2021-09-30 DIAGNOSIS — M316 Other giant cell arteritis: Secondary | ICD-10-CM

## 2021-09-30 DIAGNOSIS — G4733 Obstructive sleep apnea (adult) (pediatric): Secondary | ICD-10-CM

## 2021-09-30 DIAGNOSIS — Z86010 Personal history of colonic polyps: Secondary | ICD-10-CM

## 2021-09-30 DIAGNOSIS — J302 Other seasonal allergic rhinitis: Secondary | ICD-10-CM

## 2021-09-30 DIAGNOSIS — G453 Amaurosis fugax: Secondary | ICD-10-CM

## 2021-09-30 DIAGNOSIS — Z13820 Encounter for screening for osteoporosis: Secondary | ICD-10-CM

## 2021-09-30 DIAGNOSIS — E559 Vitamin D deficiency, unspecified: Secondary | ICD-10-CM

## 2021-09-30 DIAGNOSIS — Z79899 Other long term (current) drug therapy: Secondary | ICD-10-CM

## 2021-09-30 DIAGNOSIS — R972 Elevated prostate specific antigen [PSA]: Secondary | ICD-10-CM

## 2021-09-30 DIAGNOSIS — Z7952 Long term (current) use of systemic steroids: Secondary | ICD-10-CM

## 2021-10-21 ENCOUNTER — Ambulatory Visit: Payer: Medicare HMO | Admitting: Urology

## 2021-12-23 ENCOUNTER — Encounter: Payer: Self-pay | Admitting: Internal Medicine

## 2021-12-28 ENCOUNTER — Other Ambulatory Visit: Payer: Self-pay | Admitting: Family Medicine

## 2022-02-03 ENCOUNTER — Ambulatory Visit (AMBULATORY_SURGERY_CENTER): Payer: Medicare HMO

## 2022-02-03 VITALS — Ht 67.0 in | Wt 170.0 lb

## 2022-02-03 DIAGNOSIS — Z86010 Personal history of colonic polyps: Secondary | ICD-10-CM

## 2022-02-03 MED ORDER — NA SULFATE-K SULFATE-MG SULF 17.5-3.13-1.6 GM/177ML PO SOLN: 1.0000 | Freq: Once | ORAL | 0 refills | Status: AC

## 2022-02-03 NOTE — Progress Notes (Signed)
No egg or soy allergy known to patient  No issues known to pt with past sedation with any surgeries or procedures: patient states he is sensitive to sedation and it takes him a while to recover after being putting to sleep effects tend to linger Patient denies ever being told they had issues or difficulty with intubation  No FH of Malignant Hyperthermia Pt is not on diet pills Pt is not on  home 02  Pt is not on blood thinners  Pt denies issues with constipation  No A fib or A flutter Have any cardiac testing pending--NO Pt instructed to use [email or web address][employer]com or GoodRx for a price reduction on prep   Patient's chart reviewed by Osvaldo Angst CNRA prior to previsit and patient appropriate for the [facility].  Previsit completed and red dot placed by patient's name on their procedure day (on provider's schedule).

## 2022-02-24 ENCOUNTER — Encounter: Payer: Self-pay | Admitting: Internal Medicine

## 2022-02-24 ENCOUNTER — Ambulatory Visit (AMBULATORY_SURGERY_CENTER): Payer: Medicare HMO | Admitting: Internal Medicine

## 2022-02-24 VITALS — BP 107/62 | HR 50 | Temp 97.8°F | Resp 12 | Ht 67.0 in | Wt 169.4 lb

## 2022-02-24 DIAGNOSIS — Z09 Encounter for follow-up examination after completed treatment for conditions other than malignant neoplasm: Secondary | ICD-10-CM

## 2022-02-24 DIAGNOSIS — Z86010 Personal history of colonic polyps: Secondary | ICD-10-CM | POA: Diagnosis not present

## 2022-02-24 DIAGNOSIS — D122 Benign neoplasm of ascending colon: Secondary | ICD-10-CM

## 2022-02-24 DIAGNOSIS — G4733 Obstructive sleep apnea (adult) (pediatric): Secondary | ICD-10-CM | POA: Diagnosis not present

## 2022-02-24 MED ORDER — SODIUM CHLORIDE 0.9 % IV SOLN: 500.0000 mL | Freq: Once | INTRAVENOUS | Status: DC

## 2022-02-24 NOTE — Progress Notes (Signed)
[facility] History and Physical   Primary Care Physician:  Claretta Fraise, MD   Reason for Procedure:   Hx colon polyps  Plan:    colonoscopy     HPI: Michael Joseph is a 72 y.o. male w/ prior colon polyp removal as below.  2003 - small adenoma 08/28/2013 2 small polyps - 1 adenoma and 1 lymphoid polyp -  Past Medical History:  Diagnosis Date   Allergy    Complication of anesthesia    Environmental allergies    Obstructive sleep apnea    Personal history of colonic polyps - adenoma 08/28/2013   Sleep apnea     Past Surgical History:  Procedure Laterality Date   ARTERY BIOPSY Left 08/11/2021   Procedure: TEMPORAL ARTERY BIOPSY;  Surgeon: Broadus John, MD;  Location: [facility];  Service: Vascular;  Laterality: Left;   COLONOSCOPY     FRACTURE SURGERY     left wrist   NASAL SEPTUM SURGERY  1987   WRIST SURGERY Left     Prior to Admission medications   Medication Sig Start Date End Date Taking? Authorizing Provider  Ascorbic Acid (VITAMIN C PO) Take 1 tablet by mouth daily.   Yes [provider]  docusate sodium (COLACE) 100 MG capsule Take 100 mg by mouth in the morning.   Yes [provider]  fexofenadine-pseudoephedrine (ALLEGRA-D 24) 180-240 MG 24 hr tablet Take 1 tablet by mouth every evening. For allergy and congestion Patient taking differently: Take 1 tablet by mouth daily as needed (allergy/congestion.). 02/04/21  Yes Stacks, Cletus Gash, MD  finasteride (PROSCAR) 5 MG tablet TAKE 1 TABLET DAILY 12/28/21  Yes Claretta Fraise, MD  fluticasone [facility]) 50 MCG/ACT nasal spray Place 2 sprays into both nostrils daily. Patient taking differently: Place 2 sprays into both nostrils every evening. 10/10/19  Yes Claretta Fraise, MD  Multiple Vitamin (MULTIVITAMIN WITH MINERALS) TABS tablet Take 1 tablet by mouth in the morning.   Yes [provider]  tamsulosin (FLOMAX) 0.4 MG CAPS capsule TAKE 2 CAPSULES AT BEDTIME FOR URINE FLOW AND  PROSTATE 08/25/21  Yes Stacks, Cletus Gash, MD  Vitamin D, Ergocalciferol, (DRISDOL) 1.25 MG (50000 UNIT) CAPS capsule Take 1 capsule (50,000 Units total) by mouth every 7 (seven) days. 09/10/21  Yes Claretta Fraise, MD  albuterol (PROAIR HFA) 108 (90 Base) MCG/ACT inhaler Inhale 2 puffs into the lungs every 6 (six) hours as needed for wheezing or shortness of breath. 30-45 min prior to exercise Need to be seen 10/10/19   Claretta Fraise, MD    Current Outpatient Medications  Medication Sig Dispense Refill   Ascorbic Acid (VITAMIN C PO) Take 1 tablet by mouth daily.     docusate sodium (COLACE) 100 MG capsule Take 100 mg by mouth in the morning.     fexofenadine-pseudoephedrine (ALLEGRA-D 24) 180-240 MG 24 hr tablet Take 1 tablet by mouth every evening. For allergy and congestion (Patient taking differently: Take 1 tablet by mouth daily as needed (allergy/congestion.).) 30 tablet 11   finasteride (PROSCAR) 5 MG tablet TAKE 1 TABLET DAILY 90 tablet 0   fluticasone (FLONASE) 50 MCG/ACT nasal spray Place 2 sprays into both nostrils daily. (Patient taking differently: Place 2 sprays into both nostrils every evening.) 48 g 1   Multiple Vitamin (MULTIVITAMIN WITH MINERALS) TABS tablet Take 1 tablet by mouth in the morning.     tamsulosin (FLOMAX) 0.4 MG CAPS capsule TAKE 2 CAPSULES AT BEDTIME FOR URINE FLOW AND PROSTATE 180 capsule 3  Vitamin D, Ergocalciferol, (DRISDOL) 1.25 MG (50000 UNIT) CAPS capsule Take 1 capsule (50,000 Units total) by mouth every 7 (seven) days. 13 capsule 3   albuterol (PROAIR HFA) 108 (90 Base) MCG/ACT inhaler Inhale 2 puffs into the lungs every 6 (six) hours as needed for wheezing or shortness of breath. 30-45 min prior to exercise Need to be seen 8.5 g 0   Current Facility-Administered Medications  Medication Dose Route Frequency Provider Last Rate Last Admin   0.9 %  sodium chloride infusion  500 mL Intravenous Once Gatha Mayer, MD        Allergies as of 02/24/2022 - Review  Complete 02/24/2022  Allergen Reaction Noted   Amoxicillin Rash 03/24/2011    Family History  Problem Relation Age of Onset   Diabetes Mother    Healthy Daughter    Healthy Son    Colon cancer Neg Hx    Pancreatic cancer Neg Hx    Rectal cancer Neg Hx    Stomach cancer Neg Hx    Colon polyps Neg Hx    Esophageal cancer Neg Hx     Social History   Socioeconomic History   Marital status: Married    Spouse name: Manuela Schwartz   Number of children: 2   Years of education: Not on file   Highest education level: Not on file  Occupational History   Not on file  Tobacco Use   Smoking status: Former    Types: Pipe    Quit date: 01/26/2003    Years since quitting: 19.0    Passive exposure: Never   Smokeless tobacco: Never  Vaping Use   Vaping Use: Never used  Substance and Sexual Activity   Alcohol use: Yes    Alcohol/week: 2.0 standard drinks of alcohol    Types: 2 Cans of beer per week    Comment: occ   Drug use: No   Sexual activity: Yes  Other Topics Concern   Not on file  Social History Narrative   Married x 42 years.    1 grandchildren   Social Determinants of Health   Financial Resource Strain: Low Risk  (03/30/2021)   Overall Financial Resource Strain (CARDIA)    Difficulty of Paying Living Expenses: Not hard at all  Food Insecurity: No Food Insecurity (03/30/2021)   Hunger Vital Sign    Worried About Running Out of Food in the Last Year: Never true    Ran Out of Food in the Last Year: Never true  Transportation Needs: No Transportation Needs (03/30/2021)   PRAPARE - Hydrologist (Medical): No    Lack of Transportation (Non-Medical): No  Physical Activity: Sufficiently Active (03/30/2021)   Exercise Vital Sign    Days of Exercise per Week: 5 days    Minutes of Exercise per Session: 30 min  Stress: No Stress Concern Present (03/30/2021)   [place]    Feeling of Stress : Not at  all  Social Connections: [place] (03/30/2021)   Social Connection and Isolation Panel [NHANES]    Frequency of Communication with Friends and Family: More than three times a week    Frequency of Social Gatherings with Friends and Family: More than three times a week    Attends Religious Services: More than 4 times per year    Active Member of [employer] or Organizations: Yes    Attends Archivist Meetings: More than 4 times per year  Marital Status: Married  Human resources officer Violence: Not At Risk (03/30/2021)   Humiliation, Afraid, Rape, and Kick questionnaire    Fear of Current or Ex-Partner: No    Emotionally Abused: No    Physically Abused: No    Sexually Abused: No    Review of Systems:  All other review of systems negative except as mentioned in the HPI.  Physical Exam: Vital signs BP 124/71   Pulse 63   Temp 97.8 F (36.6 C) (Temporal)   Ht '5\' 7"'$  (1.702 m)   Wt 169 lb 6.4 oz (76.8 kg)   SpO2 97%   BMI 26.53 kg/m   General:   Alert,  Well-developed, well-nourished, pleasant and cooperative in NAD Lungs:  Clear throughout to auscultation.   Heart:  Regular rate and rhythm; no murmurs, clicks, rubs,  or gallops. Abdomen:  Soft, nontender and nondistended. Normal bowel sounds.   Neuro/Psych:  Alert and cooperative. Normal mood and affect. A and O x 3   '@Tyjanae Bartek'$  Simonne Maffucci, MD, [facility] [phone] (pager) 02/24/2022 11:28 AM@

## 2022-02-24 NOTE — Op Note (Signed)
[place] Patient Name: Michael Joseph Procedure Date: 02/24/2022 11:26 AM MRN: [id number] Endoscopist: Gatha Mayer , MD, [id number] Age: 72 Referring MD:  Date of Birth: 1951-01-11 Gender: Male Account #: [id number] Procedure:                Colonoscopy Indications:              Surveillance: Personal history of adenomatous                            polyps on last colonoscopy > 5 years ago, Last                            colonoscopy: 2015 Medicines:                Monitored Anesthesia Care Procedure:                Pre-Anesthesia Assessment:                           - Prior to the procedure, a History and Physical                            was performed, and patient medications and                            allergies were reviewed. The patient's tolerance of                            previous anesthesia was also reviewed. The risks                            and benefits of the procedure and the sedation                            options and risks were discussed with the patient.                            All questions were answered, and informed consent                            was obtained. Prior Anticoagulants: The patient has                            taken no anticoagulant or antiplatelet agents. ASA                            Grade Assessment: II - A patient with mild systemic                            disease. After reviewing the risks and benefits,                            the patient was deemed in satisfactory condition to  undergo the procedure.                           After obtaining informed consent, the colonoscope                            was passed under direct vision. Throughout the                            procedure, the patient's blood pressure, pulse, and                            oxygen saturations were monitored continuously. The                            CF [id number] #[id number] was introduced through the  anus                            and advanced to the the cecum, identified by                            appendiceal orifice and ileocecal valve. The                            colonoscopy was performed without difficulty. The                            patient tolerated the procedure well. The quality                            of the bowel preparation was adequate. The                            ileocecal valve, appendiceal orifice, and rectum                            were photographed. The bowel preparation used was                            SUPREP via split dose instruction. Scope In: 11:36:59 AM Scope Out: 11:50:16 AM Scope Withdrawal Time: 0 hours 9 minutes 47 seconds  Total Procedure Duration: 0 hours 13 minutes 17 seconds  Findings:                 The digital rectal exam findings include enlarged                            prostate. Pertinent negatives include no palpable                            rectal lesions.                           A 2 mm polyp was found in the ascending colon. The  polyp was sessile. The polyp was removed with a                            cold snare. Resection and retrieval were complete.                            Verification of patient identification for the                            specimen was done. Estimated blood loss was minimal.                           The exam was otherwise without abnormality on                            direct and retroflexion views. Complications:            No immediate complications. Estimated Blood Loss:     Estimated blood loss was minimal. Impression:               - Enlarged prostate found on digital rectal exam.                           - One 2 mm polyp in the ascending colon, removed                            with a cold snare. Resected and retrieved.                           - The examination was otherwise normal on direct                            and retroflexion views.                            - Personal history of colonic polyps. 2003 - small                            adenoma                           08/28/2013 2 small polyps - 1 adenoma and 1 lymphoid                            polyp - Recommendation:           - Patient has a contact number available for                            emergencies. The signs and symptoms of potential                            delayed complications were discussed with the  patient. Return to normal activities tomorrow.                            Written discharge instructions were provided to the                            patient.                           - Resume previous diet.                           - Continue present medications.                           - Repeat colonoscopy is recommended. The                            colonoscopy date will be determined after pathology                            results from today's exam become available for                            review. Gatha Mayer, MD 02/24/2022 11:57:31 AM This report has been signed electronically.

## 2022-02-24 NOTE — Progress Notes (Signed)
VS completed by DT.  Pt's states no medical or surgical changes since previsit or office visit.  

## 2022-02-24 NOTE — Patient Instructions (Addendum)
I found and removed one tiny polyp. I will let you know pathology results and when to have another routine colonoscopy by mail and/or My Chart.  I appreciate the opportunity to care for you. Gatha Mayer, MD, FACG YOU HAD AN ENDOSCOPIC PROCEDURE TODAY AT [facility]:   Refer to the procedure report that was given to you for any specific questions about what was found during the examination.  If the procedure report does not answer your questions, please call your gastroenterologist to clarify.  If you requested that your care partner not be given the details of your procedure findings, then the procedure report has been included in a sealed envelope for you to review at your convenience later.  YOU SHOULD EXPECT: Some feelings of bloating in the abdomen. Passage of more gas than usual.  Walking can help get rid of the air that was put into your GI tract during the procedure and reduce the bloating. If you had a lower endoscopy (such as a colonoscopy or flexible sigmoidoscopy) you may notice spotting of blood in your stool or on the toilet paper. If you underwent a bowel prep for your procedure, you may not have a normal bowel movement for a few days.  Please Note:  You might notice some irritation and congestion in your nose or some drainage.  This is from the oxygen used during your procedure.  There is no need for concern and it should clear up in a day or so.  SYMPTOMS TO REPORT IMMEDIATELY:  Following lower endoscopy (colonoscopy or flexible sigmoidoscopy):  Excessive amounts of blood in the stool  Significant tenderness or worsening of abdominal pains  Swelling of the abdomen that is new, acute  Fever of 100F or higher    For urgent or emergent issues, a gastroenterologist can be reached at any hour by calling [phone]. Do not use MyChart messaging for urgent concerns.    DIET:  We do recommend a small meal at first, but then you may proceed to your regular  diet.  Drink plenty of fluids but you should avoid alcoholic beverages for 24 hours.  ACTIVITY:  You should plan to take it easy for the rest of today and you should NOT DRIVE or use heavy machinery until tomorrow (because of the sedation medicines used during the test).    FOLLOW UP: Our staff will call the number listed on your records the next business day following your procedure.  We will call around 7:15- 8:00 am to check on you and address any questions or concerns that you may have regarding the information given to you following your procedure. If we do not reach you, we will leave a message.     If any biopsies were taken you will be contacted by phone or by letter within the next 1-3 weeks.  Please call us at [phone] if you have not heard about the biopsies in 3 weeks.    SIGNATURES/CONFIDENTIALITY: You and/or your care partner have signed paperwork which will be entered into your electronic medical record.  These signatures attest to the fact that that the information above on your After Visit Summary has been reviewed and is understood.  Full responsibility of the confidentiality of this discharge information lies with you and/or your care-partner.

## 2022-02-24 NOTE — Progress Notes (Signed)
Called to room to assist during endoscopic procedure.  Patient ID and intended procedure confirmed with present staff. Received instructions for my participation in the procedure from the performing physician.  

## 2022-02-24 NOTE — Progress Notes (Signed)
Sedate, gd SR, tolerated procedure well, VSS, report to RN 

## 2022-02-25 ENCOUNTER — Telehealth: Payer: Self-pay

## 2022-02-25 NOTE — Telephone Encounter (Signed)
  Follow up Call-     02/24/2022   10:35 AM  Call back number  Post procedure Call Back phone  # [phone]  Permission to leave phone message Yes     Patient questions:  Do you have a fever, pain , or abdominal swelling? No. Pain Score  0 *  Have you tolerated food without any problems? Yes.    Have you been able to return to your normal activities? Yes.    Do you have any questions about your discharge instructions: Diet   No. Medications  No. Follow up visit  No.  Do you have questions or concerns about your Care? No.  Actions: * If pain score is 4 or above: No action needed, pain <4.

## 2022-03-06 ENCOUNTER — Encounter: Payer: Self-pay | Admitting: Internal Medicine

## 2022-03-06 DIAGNOSIS — Z86010 Personal history of colonic polyps: Secondary | ICD-10-CM

## 2022-03-17 ENCOUNTER — Encounter: Payer: Self-pay | Admitting: Family Medicine

## 2022-03-17 ENCOUNTER — Ambulatory Visit (INDEPENDENT_AMBULATORY_CARE_PROVIDER_SITE_OTHER): Payer: Medicare HMO | Admitting: Family Medicine

## 2022-03-17 VITALS — BP 114/60 | HR 63 | Temp 98.2°F | Ht 67.0 in | Wt 175.4 lb

## 2022-03-17 DIAGNOSIS — N401 Enlarged prostate with lower urinary tract symptoms: Secondary | ICD-10-CM | POA: Diagnosis not present

## 2022-03-17 DIAGNOSIS — J4520 Mild intermittent asthma, uncomplicated: Secondary | ICD-10-CM

## 2022-03-17 DIAGNOSIS — M316 Other giant cell arteritis: Secondary | ICD-10-CM | POA: Diagnosis not present

## 2022-03-17 DIAGNOSIS — N138 Other obstructive and reflux uropathy: Secondary | ICD-10-CM

## 2022-03-17 DIAGNOSIS — Z136 Encounter for screening for cardiovascular disorders: Secondary | ICD-10-CM | POA: Diagnosis not present

## 2022-03-17 MED ORDER — FINASTERIDE 5 MG PO TABS: 5.0000 mg | ORAL_TABLET | Freq: Every day | ORAL | 3 refills | Status: DC

## 2022-03-17 MED ORDER — LEVALBUTEROL TARTRATE 45 MCG/ACT IN AERO: 2.0000 | INHALATION_SPRAY | RESPIRATORY_TRACT | 12 refills | Status: DC | PRN

## 2022-03-17 NOTE — Progress Notes (Signed)
Subjective:  Patient ID: Michael Joseph, male    DOB: Jun 01, 1950  Age: 72 y.o. MRN: [id number]  CC: Medical Management of Chronic Issues   HPI Michael Joseph presents for BPH. Really better with proscar.  No dyspnea, but uses albuterol prn with sports, especially when allergies flare. It makes him irritable and jittery.  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 Due for follow up sed rate.      03/17/2022    8:04 AM 09/10/2021   10:04 AM 08/25/2021   11:12 AM  Depression screen PHQ 2/9  Decreased Interest 0 0 0  Down, Depressed, Hopeless 0 0 0  PHQ - 2 Score 0 0 0    History Michael Joseph has a past medical history of Allergy, Complication of anesthesia, Environmental allergies, Obstructive sleep apnea, Personal history of colonic polyps - adenoma (08/28/2013), and Sleep apnea.   He has a past surgical history that includes Wrist surgery (Left); Nasal septum surgery (1987); Fracture surgery; Artery Biopsy (Left, 08/11/2021); and Colonoscopy.   His family history includes Diabetes in his mother; Healthy in his daughter and son.He reports that he quit smoking about 19 years ago. His smoking use included pipe. He has never been exposed to tobacco smoke. He has never used smokeless tobacco. He reports current alcohol use of about 2.0 standard drinks of alcohol per week. He reports that he does not use drugs.    ROS Review of Systems  Constitutional:  Negative for fever.  Respiratory:  Negative for shortness of breath.   Cardiovascular:  Negative for chest pain.  Musculoskeletal:  Negative for arthralgias.  Skin:  Negative for rash.    Objective:  BP 114/60   Pulse 63   Temp 98.2 F (36.8 C)   Ht 5' 7"$  (1.702 m)   Wt 175 lb 6.4 oz (79.6 kg)   SpO2 95%   BMI 27.47 kg/m   BP Readings from Last 3 Encounters:  03/17/22 114/60  02/24/22 107/62  09/16/21 (!) 152/78    Wt Readings from Last 3 Encounters:  03/17/22 175 lb 6.4 oz (79.6 kg)  02/24/22 169 lb 6.4 oz (76.8 kg)   02/03/22 170 lb (77.1 kg)     Physical Exam Vitals reviewed.  Constitutional:      Appearance: He is well-developed.  HENT:     Head: Normocephalic and atraumatic.     Right Ear: External ear normal.     Left Ear: External ear normal.     Mouth/Throat:     Pharynx: No oropharyngeal exudate or posterior oropharyngeal erythema.  Eyes:     Pupils: Pupils are equal, round, and reactive to light.  Cardiovascular:     Rate and Rhythm: Normal rate and regular rhythm.     Heart sounds: No murmur heard. Pulmonary:     Effort: No respiratory distress.     Breath sounds: Normal breath sounds.  Musculoskeletal:     Cervical back: Normal range of motion and neck supple.  Neurological:     Mental Status: He is alert and oriented to person, place, and time.       Assessment & Plan:   Michael Joseph was seen today for medical management of chronic issues.  Diagnoses and all orders for this visit:  BPH with obstruction/lower urinary tract symptoms -     finasteride (PROSCAR) 5 MG tablet; Take 1 tablet (5 mg total) by mouth daily. -     CMP14+EGFR -     CBC with Differential/Platelet  Giant cell  arteritis (HCC) -     CMP14+EGFR -     CBC with Differential/Platelet -     Lipid panel -     Sedimentation rate  Mild intermittent asthma without complication -     0000000 -     CBC with Differential/Platelet -     levalbuterol (XOPENEX HFA) 45 MCG/ACT inhaler; Inhale 2 puffs into the lungs every 4 (four) hours as needed for wheezing.       I have changed Michael Joseph's finasteride. I am also having him start on levalbuterol. Additionally, I am having him maintain his albuterol, fluticasone, fexofenadine-pseudoephedrine, multivitamin with minerals, Ascorbic Acid (VITAMIN C PO), docusate sodium, tamsulosin, and Vitamin D (Ergocalciferol).  Allergies as of 03/17/2022       Reactions   Amoxicillin Rash   Skin rash        Medication List        Accurate as of March 17, 2022  9:38 AM. If you have any questions, ask your nurse or doctor.          albuterol 108 (90 Base) MCG/ACT inhaler Commonly known as: ProAir HFA Inhale 2 puffs into the lungs every 6 (six) hours as needed for wheezing or shortness of breath. 30-45 min prior to exercise Need to be seen   docusate sodium 100 MG capsule Commonly known as: COLACE Take 100 mg by mouth in the morning.   fexofenadine-pseudoephedrine 180-240 MG 24 hr tablet Commonly known as: ALLEGRA-D 24 Take 1 tablet by mouth every evening. For allergy and congestion What changed:  when to take this reasons to take this additional instructions   finasteride 5 MG tablet Commonly known as: PROSCAR Take 1 tablet (5 mg total) by mouth daily.   fluticasone 50 MCG/ACT nasal spray Commonly known as: FLONASE Place 2 sprays into both nostrils daily. What changed: when to take this   levalbuterol 45 MCG/ACT inhaler Commonly known as: XOPENEX HFA Inhale 2 puffs into the lungs every 4 (four) hours as needed for wheezing. Started by: Claretta Fraise, MD   multivitamin with minerals Tabs tablet Take 1 tablet by mouth in the morning.   tamsulosin 0.4 MG Caps capsule Commonly known as: FLOMAX TAKE 2 CAPSULES AT BEDTIME FOR URINE FLOW AND PROSTATE   VITAMIN C PO Take 1 tablet by mouth daily.   Vitamin D (Ergocalciferol) 1.25 MG (50000 UNIT) Caps capsule Commonly known as: DRISDOL Take 1 capsule (50,000 Units total) by mouth every 7 (seven) days.         Follow-up: Return in about 6 months (around 09/15/2022).  Claretta Fraise, M.D.

## 2022-03-18 LAB — CBC WITH DIFFERENTIAL/PLATELET
Basophils Absolute: 0 10*3/uL (ref 0.0–0.2)
Basos: 1 %
EOS (ABSOLUTE): 0.2 10*3/uL (ref 0.0–0.4)
Eos: 5 %
Hematocrit: 45.4 % (ref 37.5–51.0)
Hemoglobin: 15.1 g/dL (ref 13.0–17.7)
Immature Grans (Abs): 0 10*3/uL (ref 0.0–0.1)
Immature Granulocytes: 0 %
Lymphocytes Absolute: 1.7 10*3/uL (ref 0.7–3.1)
Lymphs: 39 %
MCH: 27.4 pg (ref 26.6–33.0)
MCHC: 33.3 g/dL (ref 31.5–35.7)
MCV: 82 fL (ref 79–97)
Monocytes Absolute: 0.4 10*3/uL (ref 0.1–0.9)
Monocytes: 8 %
Neutrophils Absolute: 2.1 10*3/uL (ref 1.4–7.0)
Neutrophils: 47 %
Platelets: 283 10*3/uL (ref 150–450)
RBC: 5.52 x10E6/uL (ref 4.14–5.80)
RDW: 13.7 % (ref 11.6–15.4)
WBC: 4.5 10*3/uL (ref 3.4–10.8)

## 2022-03-18 LAB — CMP14+EGFR
ALT: 17 IU/L (ref 0–44)
AST: 20 IU/L (ref 0–40)
Albumin/Globulin Ratio: 2.1 (ref 1.2–2.2)
Albumin: 4.2 g/dL (ref 3.9–4.9)
Alkaline Phosphatase: 80 IU/L (ref 44–121)
BUN/Creatinine Ratio: 15 (ref 10–24)
BUN: 13 mg/dL (ref 8–27)
Bilirubin Total: 0.7 mg/dL (ref 0.0–1.2)
CO2: 22 mmol/L (ref 20–29)
Calcium: 8.9 mg/dL (ref 8.6–10.2)
Chloride: 104 mmol/L (ref 96–106)
Creatinine, Ser: 0.89 mg/dL (ref 0.76–1.27)
Globulin, Total: 2 g/dL (ref 1.5–4.5)
Glucose: 111 mg/dL — ABNORMAL HIGH (ref 70–99)
Potassium: 4.4 mmol/L (ref 3.5–5.2)
Sodium: 137 mmol/L (ref 134–144)
Total Protein: 6.2 g/dL (ref 6.0–8.5)
eGFR: 92 mL/min/{1.73_m2} (ref >59)

## 2022-03-18 LAB — LIPID PANEL
Chol/HDL Ratio: 4.2 ratio (ref 0.0–5.0)
Cholesterol, Total: 192 mg/dL (ref 100–199)
HDL: 46 mg/dL (ref >39)
LDL Chol Calc (NIH): 128 mg/dL — ABNORMAL HIGH (ref 0–99)
Triglycerides: 97 mg/dL (ref 0–149)
VLDL Cholesterol Cal: 18 mg/dL (ref 5–40)

## 2022-03-18 LAB — SEDIMENTATION RATE: Sed Rate: 8 mm/hr (ref 0–30)

## 2022-03-19 NOTE — Progress Notes (Signed)
Hello Michael Joseph,  Your lab result is normal and/or stable.Some minor variations that are not significant are commonly marked abnormal, but do not represent any medical problem for you.  Best regards, Claretta Fraise, M.D.

## 2022-03-31 ENCOUNTER — Other Ambulatory Visit: Payer: Self-pay | Admitting: Family Medicine

## 2022-05-05 ENCOUNTER — Ambulatory Visit (INDEPENDENT_AMBULATORY_CARE_PROVIDER_SITE_OTHER): Payer: Medicare HMO

## 2022-05-05 VITALS — Ht 68.0 in | Wt 180.0 lb

## 2022-05-05 DIAGNOSIS — Z0000 Encounter for general adult medical examination without abnormal findings: Secondary | ICD-10-CM | POA: Diagnosis not present

## 2022-05-05 NOTE — Patient Instructions (Signed)
Michael Joseph , Thank you for taking time to come for your Medicare Wellness Visit. I appreciate your ongoing commitment to your health goals. Please review the following plan we discussed and let me know if I can assist you in the future.   These are the goals we discussed:  Goals      Exercise 3x per week (30 min per time)     Continue to exercise, play tennis and golf. Do some house repairs.         This is a list of the screening recommended for you and due dates:  Health Maintenance  Topic Date Due   COVID-19 Vaccine (1) Never done   Zoster (Shingles) Vaccine (1 of 2) 06/15/2022*   Pneumonia Vaccine (1 of 1 - PCV) 08/26/2022*   Flu Shot  08/26/2022   Medicare Annual Wellness Visit  05/05/2023   DTaP/Tdap/Td vaccine (2 - Td or Tdap) 10/09/2029   Hepatitis C Screening: USPSTF Recommendation to screen - Ages 47-79 yo.  Completed   HPV Vaccine  Aged Out   Colon Cancer Screening  Discontinued  *Topic was postponed. The date shown is not the original due date.    Advanced directives: Please bring a copy of your health care power of attorney and living will to the office to be added to your chart at your convenience.   Conditions/risks identified: Aim for 30 minutes of exercise or brisk walking, 6-8 glasses of water, and 5 servings of fruits and vegetables each day.   Next appointment: Follow up in one year for your annual wellness visit.   Preventive Care 21 Years and Older, Male  Preventive care refers to lifestyle choices and visits with your health care provider that can promote health and wellness. What does preventive care include? A yearly physical exam. This is also called an annual well check. Dental exams once or twice a year. Routine eye exams. Ask your health care provider how often you should have your eyes checked. Personal lifestyle choices, including: Daily care of your teeth and gums. Regular physical activity. Eating a healthy diet. Avoiding tobacco and drug  use. Limiting alcohol use. Practicing safe sex. Taking low doses of aspirin every day. Taking vitamin and mineral supplements as recommended by your health care provider. What happens during an annual well check? The services and screenings done by your health care provider during your annual well check will depend on your age, overall health, lifestyle risk factors, and family history of disease. Counseling  Your health care provider may ask you questions about your: Alcohol use. Tobacco use. Drug use. Emotional well-being. Home and relationship well-being. Sexual activity. Eating habits. History of falls. Memory and ability to understand (cognition). Work and work Astronomer. Screening  You may have the following tests or measurements: Height, weight, and BMI. Blood pressure. Lipid and cholesterol levels. These may be checked every 5 years, or more frequently if you are over 57 years old. Skin check. Lung cancer screening. You may have this screening every year starting at age 71 if you have a 30-pack-year history of smoking and currently smoke or have quit within the past 15 years. Fecal occult blood test (FOBT) of the stool. You may have this test every year starting at age 71. Flexible sigmoidoscopy or colonoscopy. You may have a sigmoidoscopy every 5 years or a colonoscopy every 10 years starting at age 83. Prostate cancer screening. Recommendations will vary depending on your family history and other risks. Hepatitis C blood test. Hepatitis  B blood test. Sexually transmitted disease (STD) testing. Diabetes screening. This is done by checking your blood sugar (glucose) after you have not eaten for a while (fasting). You may have this done every 1-3 years. Abdominal aortic aneurysm (AAA) screening. You may need this if you are a current or former smoker. Osteoporosis. You may be screened starting at age 26 if you are at high risk. Talk with your health care provider about  your test results, treatment options, and if necessary, the need for more tests. Vaccines  Your health care provider may recommend certain vaccines, such as: Influenza vaccine. This is recommended every year. Tetanus, diphtheria, and acellular pertussis (Tdap, Td) vaccine. You may need a Td booster every 10 years. Zoster vaccine. You may need this after age 10. Pneumococcal 13-valent conjugate (PCV13) vaccine. One dose is recommended after age 48. Pneumococcal polysaccharide (PPSV23) vaccine. One dose is recommended after age 55. Talk to your health care provider about which screenings and vaccines you need and how often you need them. This information is not intended to replace advice given to you by your health care provider. Make sure you discuss any questions you have with your health care provider. Document Released: 02/07/2015 Document Revised: 10/01/2015 Document Reviewed: 11/12/2014 Elsevier Interactive Patient Education  2017 [employer] Prevention in the Home Falls can cause injuries. They can happen to people of all ages. There are many things you can do to make your home safe and to help prevent falls. What can I do on the outside of my home? Regularly fix the edges of walkways and driveways and fix any cracks. Remove anything that might make you trip as you walk through a door, such as a raised step or threshold. Trim any bushes or trees on the path to your home. Use bright outdoor lighting. Clear any walking paths of anything that might make someone trip, such as rocks or tools. Regularly check to see if handrails are loose or broken. Make sure that both sides of any steps have handrails. Any raised decks and porches should have guardrails on the edges. Have any leaves, snow, or ice cleared regularly. Use sand or salt on walking paths during winter. Clean up any spills in your garage right away. This includes oil or grease spills. What can I do in the bathroom? Use  night lights. Install grab bars by the toilet and in the tub and shower. Do not use towel bars as grab bars. Use non-skid mats or decals in the tub or shower. If you need to sit down in the shower, use a plastic, non-slip stool. Keep the floor dry. Clean up any water that spills on the floor as soon as it happens. Remove soap buildup in the tub or shower regularly. Attach bath mats securely with double-sided non-slip rug tape. Do not have throw rugs and other things on the floor that can make you trip. What can I do in the bedroom? Use night lights. Make sure that you have a light by your bed that is easy to reach. Do not use any sheets or blankets that are too big for your bed. They should not hang down onto the floor. Have a firm chair that has side arms. You can use this for support while you get dressed. Do not have throw rugs and other things on the floor that can make you trip. What can I do in the kitchen? Clean up any spills right away. Avoid walking on wet floors. Keep  items that you use a lot in easy-to-reach places. If you need to reach something above you, use a strong step stool that has a grab bar. Keep electrical cords out of the way. Do not use floor polish or wax that makes floors slippery. If you must use wax, use non-skid floor wax. Do not have throw rugs and other things on the floor that can make you trip. What can I do with my stairs? Do not leave any items on the stairs. Make sure that there are handrails on both sides of the stairs and use them. Fix handrails that are broken or loose. Make sure that handrails are as long as the stairways. Check any carpeting to make sure that it is firmly attached to the stairs. Fix any carpet that is loose or worn. Avoid having throw rugs at the top or bottom of the stairs. If you do have throw rugs, attach them to the floor with carpet tape. Make sure that you have a light switch at the top of the stairs and the bottom of the  stairs. If you do not have them, ask someone to add them for you. What else can I do to help prevent falls? Wear shoes that: Do not have high heels. Have rubber bottoms. Are comfortable and fit you well. Are closed at the toe. Do not wear sandals. If you use a stepladder: Make sure that it is fully opened. Do not climb a closed stepladder. Make sure that both sides of the stepladder are locked into place. Ask someone to hold it for you, if possible. Clearly mark and make sure that you can see: Any grab bars or handrails. First and last steps. Where the edge of each step is. Use tools that help you move around (mobility aids) if they are needed. These include: Canes. Walkers. Scooters. Crutches. Turn on the lights when you go into a dark area. Replace any light bulbs as soon as they burn out. Set up your furniture so you have a clear path. Avoid moving your furniture around. If any of your floors are uneven, fix them. If there are any pets around you, be aware of where they are. Review your medicines with your doctor. Some medicines can make you feel dizzy. This can increase your chance of falling. Ask your doctor what other things that you can do to help prevent falls. This information is not intended to replace advice given to you by your health care provider. Make sure you discuss any questions you have with your health care provider. Document Released: 11/07/2008 Document Revised: 06/19/2015 Document Reviewed: 02/15/2014 Elsevier Interactive Patient Education  2017 [employer].

## 2022-05-05 NOTE — Progress Notes (Signed)
Subjective:   Michael Joseph is a 72 y.o. male who presents for an Initial Medicare Annual Wellness Visit. I connected with  BENNET KUJAWA on 05/05/22 by a audio enabled telemedicine application and verified that I am speaking with the correct person using two identifiers.  Patient Location: Home  Provider Location: Home Office  I discussed the limitations of evaluation and management by telemedicine. The patient expressed understanding and agreed to proceed.  Review of Systems     Cardiac Risk Factors include: advanced age (>82men, >71 women);male gender     Objective:    Today's Vitals   05/05/22 0820  Weight: 180 lb (81.6 kg)  Height: 5\' 8"  (1.727 m)   Body mass index is 27.37 kg/m.     05/05/2022    8:23 AM 08/11/2021    6:29 AM 07/16/2021    1:25 PM 07/12/2021   11:40 AM 03/30/2021    8:24 AM 08/13/2013    1:55 PM  Advanced Directives  Does Patient Have a Medical Advance Directive? Yes Yes No Yes Yes Patient does not have advance directive  Type of Advance Directive Healthcare Power of [place];Living will Healthcare Power of [place];Living will  Healthcare Power of [place];Living will Healthcare Power of [place];Living will   Does patient want to make changes to medical advance directive?  No - Patient declined      Copy of Healthcare Power of Attorney in Chart? No - copy requested No - copy requested   No - copy requested   Pre-existing out of facility DNR order (yellow form or pink MOST form)      No    Current Medications (verified) Outpatient Encounter Medications as of 05/05/2022  Medication Sig   albuterol (PROAIR HFA) 108 (90 Base) MCG/ACT inhaler Inhale 2 puffs into the lungs every 6 (six) hours as needed for wheezing or shortness of breath. 30-45 min prior to exercise Need to be seen   Ascorbic Acid (VITAMIN C PO) Take 1 tablet by mouth daily.   docusate sodium (COLACE) 100 MG capsule Take 100 mg by mouth in the morning.   fexofenadine-pseudoephedrine  (ALLEGRA-D 24) 180-240 MG 24 hr tablet Take 1 tablet by mouth every evening. For allergy and congestion (Patient taking differently: Take 1 tablet by mouth daily as needed (allergy/congestion.).)   finasteride (PROSCAR) 5 MG tablet Take 1 tablet (5 mg total) by mouth daily.   fluticasone (FLONASE) 50 MCG/ACT nasal spray USE 2 SPRAYS IN EACH NOSTRIL ONCE DAILY.   levalbuterol (XOPENEX HFA) 45 MCG/ACT inhaler Inhale 2 puffs into the lungs every 4 (four) hours as needed for wheezing.   Multiple Vitamin (MULTIVITAMIN WITH MINERALS) TABS tablet Take 1 tablet by mouth in the morning.   tamsulosin (FLOMAX) 0.4 MG CAPS capsule TAKE 2 CAPSULES AT BEDTIME FOR URINE FLOW AND PROSTATE   Vitamin D, Ergocalciferol, (DRISDOL) 1.25 MG (50000 UNIT) CAPS capsule Take 1 capsule (50,000 Units total) by mouth every 7 (seven) days.   No facility-administered encounter medications on file as of 05/05/2022.    Allergies (verified) Amoxicillin   History: Past Medical History:  Diagnosis Date   Allergy    Complication of anesthesia    Environmental allergies    Obstructive sleep apnea    Personal history of colonic polyps - adenoma 08/28/2013   Sleep apnea    Past Surgical History:  Procedure Laterality Date   ARTERY BIOPSY Left 08/11/2021   Procedure: TEMPORAL ARTERY BIOPSY;  Surgeon: Victorino Sparrow, MD;  Location: [facility]  OR;  Service: Vascular;  Laterality: Left;   COLONOSCOPY     FRACTURE SURGERY     left wrist   NASAL SEPTUM SURGERY  1987   WRIST SURGERY Left    Family History  Problem Relation Age of Onset   Diabetes Mother    Healthy Daughter    Healthy Son    Colon cancer Neg Hx    Pancreatic cancer Neg Hx    Rectal cancer Neg Hx    Stomach cancer Neg Hx    Colon polyps Neg Hx    Esophageal cancer Neg Hx    Social History   Socioeconomic History   Marital status: Married    Spouse name: Darl Pikes   Number of children: 2   Years of education: Not on file   Highest education level: Not on  file  Occupational History   Not on file  Tobacco Use   Smoking status: Former    Types: Pipe    Quit date: 01/26/2003    Years since quitting: 19.2    Passive exposure: Never   Smokeless tobacco: Never  Vaping Use   Vaping Use: Never used  Substance and Sexual Activity   Alcohol use: Yes    Alcohol/week: 2.0 standard drinks of alcohol    Types: 2 Cans of beer per week    Comment: occ   Drug use: No   Sexual activity: Yes  Other Topics Concern   Not on file  Social History Narrative   Married x 42 years.    1 grandchildren   Social Determinants of Health   Financial Resource Strain: Low Risk  (05/05/2022)   Overall Financial Resource Strain (CARDIA)    Difficulty of Paying Living Expenses: Not hard at all  Food Insecurity: No Food Insecurity (05/05/2022)   Hunger Vital Sign    Worried About Running Out of Food in the Last Year: Never true    Ran Out of Food in the Last Year: Never true  Transportation Needs: No Transportation Needs (05/05/2022)   PRAPARE - Administrator, Civil Service (Medical): No    Lack of Transportation (Non-Medical): No  Physical Activity: Sufficiently Active (05/05/2022)   Exercise Vital Sign    Days of Exercise per Week: 3 days    Minutes of Exercise per Session: 90 min  Stress: No Stress Concern Present (05/05/2022)   [employer] of Occupational Health - Occupational Stress Questionnaire    Feeling of Stress : Not at all  Social Connections: Socially Integrated (05/05/2022)   Social Connection and Isolation Panel [NHANES]    Frequency of Communication with Friends and Family: More than three times a week    Frequency of Social Gatherings with Friends and Family: More than three times a week    Attends Religious Services: More than 4 times per year    Active Member of [employer] or Organizations: Yes    Attends Engineer, structural: More than 4 times per year    Marital Status: Married    Tobacco Counseling Counseling  given: Not Answered   Clinical Intake:  Pre-visit preparation completed: Yes  Pain : No/denies pain     Nutritional Risks: None Diabetes: No  How often do you need to have someone help you when you read instructions, pamphlets, or other written materials from your doctor or pharmacy?: 1 - Never  Diabetic?no   Interpreter Needed?: No  Information entered by :: Renie Ora, LPN   Activities of Daily Living  05/05/2022    8:23 AM 05/04/2022    9:16 PM  In your present state of health, do you have any difficulty performing the following activities:  Hearing? 0 0  Vision? 0 0  Difficulty concentrating or making decisions? 0 0  Walking or climbing stairs? 0 0  Dressing or bathing? 0 0  Doing errands, shopping? 0 0  Preparing Food and eating ? N N  Using the Toilet? N N  In the past six months, have you accidently leaked urine? N N  Do you have problems with loss of bowel control? N N  Managing your Medications? N N  Managing your Finances? N N  Housekeeping or managing your Housekeeping? N N    Patient Care Team: Mechele Claude, MD as PCP - General (Family Medicine)  Indicate any recent Medical Services you may have received from other than [facility] providers in the past year (date may be approximate).     Assessment:   This is a routine wellness examination for Yasuo.  Hearing/Vision screen Vision Screening - Comments:: Wears rx glasses - up to date with routine eye exams with  Dr.Lee   Dietary issues and exercise activities discussed: Current Exercise Habits: Home exercise routine, Type of exercise: walking, Time (Minutes): > 60, Frequency (Times/Week): 3, Weekly Exercise (Minutes/Week): 0, Intensity: Mild, Exercise limited by: None identified   Goals Addressed             This Visit's Progress    Exercise 3x per week (30 min per time)   On track    Continue to exercise, play tennis and golf. Do some house repairs.        Depression Screen     05/05/2022    8:22 AM 03/17/2022    8:04 AM 09/10/2021   10:04 AM 08/25/2021   11:12 AM 07/29/2021    2:46 PM 07/13/2021    2:42 PM 07/01/2021    9:49 AM  PHQ 2/9 Scores  PHQ - 2 Score 0 0 0 0 0 0 0    Fall Risk    05/05/2022    8:21 AM 05/04/2022    9:16 PM 03/17/2022    8:04 AM 09/10/2021   10:04 AM 08/25/2021   11:12 AM  Fall Risk   Falls in the past year? 0 0 0 0 0  Number falls in past yr: 0 0     Injury with Fall? 0 0     Risk for fall due to : No Fall Risks      Follow up Falls prevention discussed        FALL RISK PREVENTION PERTAINING TO THE HOME:  Any stairs in or around the home? Yes  If so, are there any without handrails? No  Home free of loose throw rugs in walkways, pet beds, electrical cords, etc? Yes  Adequate lighting in your home to reduce risk of falls? Yes   ASSISTIVE DEVICES UTILIZED TO PREVENT FALLS:  Life alert? No  Use of a cane, walker or w/c? No  Grab bars in the bathroom? Yes  Shower chair or bench in shower? Yes  Elevated toilet seat or a handicapped toilet? Yes       10/10/2019    2:35 PM  MMSE - Mini Mental State Exam  Orientation to time 5  Orientation to Place 5  Registration 3  Attention/ Calculation 5  Recall 3  Language- name 2 objects 2  Language- repeat 1  Language- follow 3 step command 3  Language- read & follow direction 1  Write a sentence 1  Copy design 1  Total score 30        05/05/2022    8:23 AM 03/30/2021    8:27 AM  6CIT Screen  What Year? 0 points 0 points  What month? 0 points 0 points  What time? 0 points 0 points  Count back from 20 0 points 0 points  Months in reverse 0 points 0 points  Repeat phrase 0 points 0 points  Total Score 0 points 0 points    Immunizations Immunization History  Administered Date(s) Administered   Hepatitis B 07/15/1999, 08/21/1999, 02/08/2000   MMR 08/11/1999   Tdap 10/10/2019    TDAP status: Up to date  Flu Vaccine status: Declined, Education has been provided regarding the  importance of this vaccine but patient still declined. Advised may receive this vaccine at local pharmacy or Health Dept. Aware to provide a copy of the vaccination record if obtained from local pharmacy or Health Dept. Verbalized acceptance and understanding.  Pneumococcal vaccine status: Declined,  Education has been provided regarding the importance of this vaccine but patient still declined. Advised may receive this vaccine at local pharmacy or Health Dept. Aware to provide a copy of the vaccination record if obtained from local pharmacy or Health Dept. Verbalized acceptance and understanding.   Covid-19 vaccine status: Declined, Education has been provided regarding the importance of this vaccine but patient still declined. Advised may receive this vaccine at local pharmacy or Health Dept.or vaccine clinic. Aware to provide a copy of the vaccination record if obtained from local pharmacy or Health Dept. Verbalized acceptance and understanding.  Qualifies for Shingles Vaccine? Yes   Zostavax completed No   Shingrix Completed?: No.    Education has been provided regarding the importance of this vaccine. Patient has been advised to call insurance company to determine out of pocket expense if they have not yet received this vaccine. Advised may also receive vaccine at local pharmacy or Health Dept. Verbalized acceptance and understanding.  Screening Tests Health Maintenance  Topic Date Due   COVID-19 Vaccine (1) Never done   Zoster Vaccines- Shingrix (1 of 2) 06/15/2022 (Originally 08/10/1969)   Pneumonia Vaccine 13+ Years old (1 of 1 - PCV) 08/26/2022 (Originally 08/09/2016)   INFLUENZA VACCINE  08/26/2022   Medicare Annual Wellness (AWV)  05/05/2023   DTaP/Tdap/Td (2 - Td or Tdap) 10/09/2029   Hepatitis C Screening  Completed   HPV VACCINES  Aged Out   COLONOSCOPY (Pts 45-57yrs Insurance coverage will need to be confirmed)  Discontinued    Health Maintenance  Health Maintenance Due  Topic  Date Due   COVID-19 Vaccine (1) Never done    Colorectal cancer screening: Type of screening: Colonoscopy. Completed 02/24/2022. Repeat every 7 years  Lung Cancer Screening: (Low Dose CT Chest recommended if Age 36-80 years, 30 pack-year currently smoking OR have quit w/in 15years.) does not qualify.   Lung Cancer Screening Referral: n/a  Additional Screening:  Hepatitis C Screening: does not qualify;   Vision Screening: Recommended annual ophthalmology exams for early detection of glaucoma and other disorders of the eye. Is the patient up to date with their annual eye exam?  Yes  Who is the provider or what is the name of the office in which the patient attends annual eye exams? Dr.Lee  If pt is not established with a provider, would they like to be referred to a provider to establish care? No .  Dental Screening: Recommended annual dental exams for proper oral hygiene  Community Resource Referral / Chronic Care Management: CRR required this visit?  No   CCM required this visit?  No      Plan:     I have personally reviewed and noted the following in the patient's chart:   Medical and social history Use of alcohol, tobacco or illicit drugs  Current medications and supplements including opioid prescriptions. Patient is not currently taking opioid prescriptions. Functional ability and status Nutritional status Physical activity Advanced directives List of other physicians Hospitalizations, surgeries, and ER visits in previous 12 months Vitals Screenings to include cognitive, depression, and falls Referrals and appointments  In addition, I have reviewed and discussed with patient certain preventive protocols, quality metrics, and best practice recommendations. A written personalized care plan for preventive services as well as general preventive health recommendations were provided to patient.     Lorrene ReidLaura L Wilson, LPN   1/61/09604/10/2022   Nurse Notes: Due Pneumonia Vaccine

## 2022-06-17 DIAGNOSIS — H25811 Combined forms of age-related cataract, right eye: Secondary | ICD-10-CM | POA: Diagnosis not present

## 2022-06-17 DIAGNOSIS — H353131 Nonexudative age-related macular degeneration, bilateral, early dry stage: Secondary | ICD-10-CM | POA: Diagnosis not present

## 2022-07-16 DIAGNOSIS — H25811 Combined forms of age-related cataract, right eye: Secondary | ICD-10-CM | POA: Diagnosis not present

## 2022-09-16 ENCOUNTER — Ambulatory Visit (INDEPENDENT_AMBULATORY_CARE_PROVIDER_SITE_OTHER): Payer: Medicare HMO | Admitting: Family Medicine

## 2022-09-16 ENCOUNTER — Encounter: Payer: Self-pay | Admitting: Family Medicine

## 2022-09-16 VITALS — BP 140/74 | HR 52 | Temp 97.7°F | Resp 20 | Ht 68.0 in | Wt 170.1 lb

## 2022-09-16 DIAGNOSIS — E782 Mixed hyperlipidemia: Secondary | ICD-10-CM

## 2022-09-16 DIAGNOSIS — E559 Vitamin D deficiency, unspecified: Secondary | ICD-10-CM | POA: Diagnosis not present

## 2022-09-16 DIAGNOSIS — N401 Enlarged prostate with lower urinary tract symptoms: Secondary | ICD-10-CM | POA: Diagnosis not present

## 2022-09-16 DIAGNOSIS — N138 Other obstructive and reflux uropathy: Secondary | ICD-10-CM | POA: Diagnosis not present

## 2022-09-16 DIAGNOSIS — Z0000 Encounter for general adult medical examination without abnormal findings: Secondary | ICD-10-CM

## 2022-09-16 DIAGNOSIS — G4733 Obstructive sleep apnea (adult) (pediatric): Secondary | ICD-10-CM | POA: Diagnosis not present

## 2022-09-16 DIAGNOSIS — Z0001 Encounter for general adult medical examination with abnormal findings: Secondary | ICD-10-CM | POA: Diagnosis not present

## 2022-09-16 LAB — URINALYSIS
Bilirubin, UA: NEGATIVE
Glucose, UA: NEGATIVE
Ketones, UA: NEGATIVE
Leukocytes,UA: NEGATIVE
Nitrite, UA: NEGATIVE
Protein,UA: NEGATIVE
RBC, UA: NEGATIVE
Specific Gravity, UA: 1.01 (ref 1.005–1.030)
Urobilinogen, Ur: 0.2 mg/dL (ref 0.2–1.0)
pH, UA: 6.5 (ref 5.0–7.5)

## 2022-09-16 MED ORDER — TADALAFIL 10 MG PO TABS: 10.0000 mg | ORAL_TABLET | ORAL | 1 refills | Status: DC | PRN

## 2022-09-16 NOTE — Patient Instructions (Signed)
Fat and Cholesterol Restricted Eating Plan Eating a diet that limits fat and cholesterol may help lower your risk for heart disease and other conditions. Your body needs fat and cholesterol for basic functions, but eating too much of these things can be harmful to your health. Your health care provider may order lab tests to check your blood fat (lipid) and cholesterol levels. This helps your health care provider understand your risk for certain conditions and whether you need to make diet changes. Work with your health care provider or dietitian to make an eating plan that is right for you. Your plan includes: Limit your fat intake to ______% or less of your total calories a day. This is ______g of fat per day. Limit your saturated fat intake to ______% or less of your total calories a day. This is ______g of saturated fat per day. Limit the amount of cholesterol in your diet to less than _________mg a day. Eat ___________ g of fiber a day. What are tips for following this plan? General guidelines If you are overweight, work with your health care provider to lose weight safely. Losing just 5-10% of your body weight can improve your overall health and help prevent diseases such as diabetes and heart disease. Avoid: Foods with added sugar. Fried foods. Foods that contain partially hydrogenated oils, including stick margarine, some tub margarines, cookies, crackers, and other baked goods. If you drink alcohol: Limit how much you have to: 0-1 drink a day for women who are not pregnant. 0-2 drinks a day for men. Know how much alcohol is in a drink. In the [place] one drink equals one 12 oz bottle of beer (355 mL), one 5 oz glass of wine (148 mL), or one 1 oz glass of hard liquor (44 mL). Reading food labels Check food labels for: Trans fats or partially hydrogenated oils. Avoid foods that contain these. High amounts of saturated fat. Choose foods that are low in saturated fat (less than 2 g). The  amount of cholesterol in each serving. The amount of fiber in each serving. Choose foods with healthy fats, such as: Monounsaturated and polyunsaturated fats. These include olive and canola oil, flaxseeds, walnuts, almonds, and seeds. Omega-3 fats. These are found in foods such as salmon, mackerel, sardines, tuna, flaxseed oil, and ground flaxseeds. Choose grain products that have whole grains. Look for the word "whole" as the first word in the ingredient list. Cooking Cook foods using methods other than frying. Baking, boiling, grilling, and broiling are some healthy options. Eat more home-cooked food and less restaurant, buffet, and fast food. Avoid cooking using saturated fats. Animal sources of saturated fats include meats, butter, and cream. Plant sources of saturated fats include palm oil, palm kernel oil, and coconut oil. Meal planning  At meals, imagine dividing your plate into fourths: Fill one-half of your plate with vegetables, green salads, and fruit. Fill one-fourth of your plate with whole grains. Fill one-fourth of your plate with lean protein foods. Eat fish that is high in omega-3 fats at least two times a week. Eat more foods that contain fiber, such as whole grains, beans, apples, pears, berries, broccoli, carrots, peas, and barley. These foods help promote healthy cholesterol levels in the blood. What foods should I eat? Fruits All fresh, canned (in natural juice), or frozen fruits. Vegetables Fresh or frozen vegetables (raw, steamed, roasted, or grilled). Green salads. Grains Whole grains, such as whole wheat or whole grain breads, crackers, cereals, and pasta. Unsweetened oatmeal, bulgur,  barley, quinoa, or brown rice. Corn or whole wheat flour tortillas. Meats and other proteins Ground beef (85% or leaner), grass-fed beef, or beef trimmed of fat. Skinless chicken or [place]. Ground chicken or [place]. Pork trimmed of fat. All fish and seafood. Egg whites. Dried beans,  peas, or lentils. Unsalted nuts or seeds. Unsalted canned beans. Natural nut butters without added sugar and oil. Dairy Low-fat or nonfat dairy products, such as skim or 1% milk, 2% or reduced-fat cheeses, low-fat and fat-free ricotta or cottage cheese, or plain low-fat and nonfat yogurt. Fats and oils Tub margarine without trans fats. Light or reduced-fat mayonnaise and salad dressings. Avocado. Olive, canola, sesame, or safflower oils. The items listed above may not be a complete list of foods and beverages you can eat. Contact a dietitian for more information. What foods should I avoid? Fruits Canned fruit in heavy syrup. Fruit in cream or butter sauce. Fried fruit. Vegetables Vegetables cooked in cheese, cream, or butter sauce. Fried vegetables. Grains White bread. White pasta. White rice. Cornbread. Bagels, pastries, and croissants. Crackers and snack foods that contain trans fat and hydrogenated oils. Meats and other proteins Fatty cuts of meat. Ribs, chicken wings, bacon, sausage, bologna, salami, chitterlings, fatback, hot dogs, bratwurst, and packaged lunch meats. Liver and organ meats. Whole eggs and egg yolks. Chicken and [place] with skin. Fried meat. Dairy Whole or 2% milk, cream, half-and-half, and cream cheese. Whole milk cheeses. Whole-fat or sweetened yogurt. Full-fat cheeses. Nondairy creamers and whipped toppings. Processed cheese, cheese spreads, and cheese curds. Fats and oils Butter, stick margarine, lard, shortening, ghee, or bacon fat. Coconut, palm kernel, and palm oils. Beverages Alcohol. Sugar-sweetened drinks such as sodas, lemonade, and fruit drinks. Sweets and desserts Corn syrup, sugars, honey, and molasses. Candy. Jam and jelly. Syrup. Sweetened cereals. Cookies, pies, cakes, donuts, muffins, and ice cream. The items listed above may not be a complete list of foods and beverages you should avoid. Contact a dietitian for more information. Summary Your body needs  fat and cholesterol for basic functions. However, eating too much of these things can be harmful to your health. Work with your health care provider and dietitian to follow a diet that limits fat and cholesterol. Doing this may help lower your risk for heart disease and other conditions. Choose healthy fats, such as monounsaturated and polyunsaturated fats, and foods high in omega-3 fatty acids. Eat fiber-rich foods, such as whole grains, beans, peas, fruits, and vegetables. Limit or avoid alcohol, fried foods, and foods high in saturated fats, partially hydrogenated oils, and sugar. This information is not intended to replace advice given to you by your health care provider. Make sure you discuss any questions you have with your health care provider. Document Revised: 05/23/2020 Document Reviewed: 05/23/2020 Elsevier Patient Education  2024 [employer].

## 2022-09-16 NOTE — Progress Notes (Signed)
Subjective:  Patient ID: Michael Joseph, male    DOB: [birth year]  Age: 72 y.o. MRN: [id number]  CC: Medical Management of Chronic Issues Due for CPE  HPI Michael Joseph presents for bph follow up. Asks for viagra or cialis. Wants prn use. Due for CPE     09/16/2022    8:06 AM 05/05/2022    8:22 AM 03/17/2022    8:04 AM  Depression screen PHQ 2/9  Decreased Interest 0 0 0  Down, Depressed, Hopeless 0 0 0  PHQ - 2 Score 0 0 0  Altered sleeping 0    Tired, decreased energy 0    Change in appetite 0    Feeling bad or failure about yourself  0    Trouble concentrating 0    Moving slowly or fidgety/restless 0    Suicidal thoughts 0    PHQ-9 Score 0      History Michael Joseph has a past medical history of Allergy, Complication of anesthesia, Environmental allergies, Obstructive sleep apnea, Personal history of colonic polyps - adenoma (08/28/2013), and Sleep apnea.   He has a past surgical history that includes Wrist surgery (Left); Nasal septum surgery (1987); Fracture surgery; Artery Biopsy (Left, 08/11/2021); and Colonoscopy.   His family history includes Diabetes in his mother; Healthy in his daughter and son.He reports that he quit smoking about 19 years ago. His smoking use included pipe. He has never been exposed to tobacco smoke. He has never used smokeless tobacco. He reports current alcohol use of about 2.0 standard drinks of alcohol per week. He reports that he does not use drugs.    ROS Review of Systems  Constitutional: Negative.   HENT: Negative.    Eyes:  Negative for visual disturbance.  Respiratory:  Negative for cough and shortness of breath.   Cardiovascular:  Negative for chest pain and leg swelling.  Gastrointestinal:  Negative for abdominal pain, diarrhea, nausea and vomiting.  Genitourinary:  Negative for difficulty urinating.  Musculoskeletal:  Negative for arthralgias and myalgias.  Skin:  Negative for rash.  Neurological:  Negative for headaches.   Psychiatric/Behavioral:  Negative for sleep disturbance.     Objective:  BP (!) 140/74   Pulse (!) 52   Temp 97.7 F (36.5 C) (Oral)   Resp 20   Ht 5\' 8"  (1.727 m)   Wt 170 lb 2 oz (77.2 kg)   SpO2 98%   BMI 25.87 kg/m   BP Readings from Last 3 Encounters:  09/16/22 (!) 140/74  03/17/22 114/60  02/24/22 107/62    Wt Readings from Last 3 Encounters:  09/16/22 170 lb 2 oz (77.2 kg)  05/05/22 180 lb (81.6 kg)  03/17/22 175 lb 6.4 oz (79.6 kg)     Physical Exam Constitutional:      General: He is not in acute distress.    Appearance: He is well-developed.  HENT:     Head: Normocephalic and atraumatic.     Right Ear: External ear normal.     Left Ear: External ear normal.     Nose: Nose normal.  Eyes:     Conjunctiva/sclera: Conjunctivae normal.     Pupils: Pupils are equal, round, and reactive to light.  Cardiovascular:     Rate and Rhythm: Normal rate and regular rhythm.     Heart sounds: Normal heart sounds. No murmur heard. Pulmonary:     Effort: Pulmonary effort is normal. No respiratory distress.     Breath sounds: Normal breath sounds. No  wheezing or rales.  Abdominal:     Palpations: Abdomen is soft.     Tenderness: There is no abdominal tenderness.  Musculoskeletal:        General: Normal range of motion.     Cervical back: Normal range of motion and neck supple.  Skin:    General: Skin is warm and dry.  Neurological:     Mental Status: He is alert and oriented to person, place, and time.     Deep Tendon Reflexes: Reflexes are normal and symmetric.  Psychiatric:        Behavior: Behavior normal.        Thought Content: Thought content normal.        Judgment: Judgment normal.       Assessment & Plan:   Michael Joseph was seen today for medical management of chronic issues.  Diagnoses and all orders for this visit:  Well adult exam  BPH with obstruction/lower urinary tract symptoms -     CBC with Differential/Platelet -     CMP14+EGFR -      PSA, total and free -     Urinalysis  Obstructive sleep apnea -     CBC with Differential/Platelet -     CMP14+EGFR  Mixed hyperlipidemia -     CBC with Differential/Platelet -     CMP14+EGFR -     Lipid panel  Vitamin D deficiency -     VITAMIN D 25 Hydroxy (Vit-D Deficiency, Fractures)  Other orders -     tadalafil (CIALIS) 10 MG tablet; Take 1 tablet (10 mg total) by mouth every other day as needed for erectile dysfunction.       I have discontinued Michael Joseph's albuterol and levalbuterol. I am also having him start on tadalafil. Additionally, I am having him maintain his fexofenadine-pseudoephedrine, multivitamin with minerals, Ascorbic Acid (VITAMIN C PO), docusate sodium, tamsulosin, Vitamin D (Ergocalciferol), finasteride, and fluticasone.  Allergies as of 09/16/2022       Reactions   Amoxicillin Rash   Skin rash        Medication List        Accurate as of September 16, 2022  9:16 AM. If you have any questions, ask your nurse or doctor.          STOP taking these medications    albuterol 108 (90 Base) MCG/ACT inhaler Commonly known as: ProAir HFA Stopped by: Michael Joseph   levalbuterol 45 MCG/ACT inhaler Commonly known as: XOPENEX HFA Stopped by: Michael Joseph       TAKE these medications    docusate sodium 100 MG capsule Commonly known as: COLACE Take 100 mg by mouth in the morning.   fexofenadine-pseudoephedrine 180-240 MG 24 hr tablet Commonly known as: ALLEGRA-D 24 Take 1 tablet by mouth every evening. For allergy and congestion What changed:  when to take this reasons to take this additional instructions   finasteride 5 MG tablet Commonly known as: PROSCAR Take 1 tablet (5 mg total) by mouth daily.   fluticasone 50 MCG/ACT nasal spray Commonly known as: FLONASE USE 2 SPRAYS IN EACH NOSTRIL ONCE DAILY.   multivitamin with minerals Tabs tablet Take 1 tablet by mouth in the morning.   tadalafil 10 MG tablet Commonly  known as: CIALIS Take 1 tablet (10 mg total) by mouth every other day as needed for erectile dysfunction. Started by: Michael Joseph   tamsulosin 0.4 MG Caps capsule Commonly known as: FLOMAX TAKE 2 CAPSULES AT BEDTIME FOR URINE FLOW  AND PROSTATE   VITAMIN C PO Take 1 tablet by mouth daily.   Vitamin D (Ergocalciferol) 1.25 MG (50000 UNIT) Caps capsule Commonly known as: DRISDOL Take 1 capsule (50,000 Units total) by mouth every 7 (seven) days.         Follow-up: Return in about 6 months (around 03/19/2023).  Mechele Claude, M.D.

## 2022-09-17 LAB — CBC WITH DIFFERENTIAL/PLATELET
Basophils Absolute: 0.1 10*3/uL (ref 0.0–0.2)
Basos: 1 %
EOS (ABSOLUTE): 0.2 10*3/uL (ref 0.0–0.4)
Eos: 5 %
Hematocrit: 47.5 % (ref 37.5–51.0)
Hemoglobin: 16 g/dL (ref 13.0–17.7)
Immature Grans (Abs): 0 10*3/uL (ref 0.0–0.1)
Immature Granulocytes: 0 %
Lymphocytes Absolute: 1.6 10*3/uL (ref 0.7–3.1)
Lymphs: 34 %
MCH: 28 pg (ref 26.6–33.0)
MCHC: 33.7 g/dL (ref 31.5–35.7)
MCV: 83 fL (ref 79–97)
Monocytes Absolute: 0.4 10*3/uL (ref 0.1–0.9)
Monocytes: 7 %
Neutrophils Absolute: 2.5 10*3/uL (ref 1.4–7.0)
Neutrophils: 53 %
Platelets: 260 10*3/uL (ref 150–450)
RBC: 5.72 x10E6/uL (ref 4.14–5.80)
RDW: 12.8 % (ref 11.6–15.4)
WBC: 4.7 10*3/uL (ref 3.4–10.8)

## 2022-09-17 LAB — CMP14+EGFR
ALT: 19 IU/L (ref 0–44)
AST: 24 IU/L (ref 0–40)
Albumin: 4.3 g/dL (ref 3.8–4.8)
Alkaline Phosphatase: 84 IU/L (ref 44–121)
BUN/Creatinine Ratio: 11 (ref 10–24)
BUN: 10 mg/dL (ref 8–27)
Bilirubin Total: 0.8 mg/dL (ref 0.0–1.2)
CO2: 24 mmol/L (ref 20–29)
Calcium: 9.5 mg/dL (ref 8.6–10.2)
Chloride: 104 mmol/L (ref 96–106)
Creatinine, Ser: 0.91 mg/dL (ref 0.76–1.27)
Globulin, Total: 2.3 g/dL (ref 1.5–4.5)
Glucose: 101 mg/dL — ABNORMAL HIGH (ref 70–99)
Potassium: 4.7 mmol/L (ref 3.5–5.2)
Sodium: 142 mmol/L (ref 134–144)
Total Protein: 6.6 g/dL (ref 6.0–8.5)
eGFR: 90 mL/min/{1.73_m2} (ref >59)

## 2022-09-17 LAB — PSA, TOTAL AND FREE
PSA, Free Pct: 15.7 %
PSA, Free: 0.36 ng/mL
Prostate Specific Ag, Serum: 2.3 ng/mL (ref 0.0–4.0)

## 2022-09-17 LAB — VITAMIN D 25 HYDROXY (VIT D DEFICIENCY, FRACTURES): Vit D, 25-Hydroxy: 32 ng/mL (ref 30.0–100.0)

## 2022-09-17 LAB — LIPID PANEL
Chol/HDL Ratio: 4 ratio (ref 0.0–5.0)
Cholesterol, Total: 190 mg/dL (ref 100–199)
HDL: 47 mg/dL (ref >39)
LDL Chol Calc (NIH): 124 mg/dL — ABNORMAL HIGH (ref 0–99)
Triglycerides: 104 mg/dL (ref 0–149)
VLDL Cholesterol Cal: 19 mg/dL (ref 5–40)

## 2022-09-20 NOTE — Progress Notes (Signed)
Hello Michael Joseph,  Your lab result is normal and/or stable.Some minor variations that are not significant are commonly marked abnormal, but do not represent any medical problem for you.  Best regards, Claretta Fraise, M.D.

## 2022-11-02 ENCOUNTER — Other Ambulatory Visit: Payer: Self-pay | Admitting: Family Medicine

## 2023-03-21 ENCOUNTER — Ambulatory Visit (INDEPENDENT_AMBULATORY_CARE_PROVIDER_SITE_OTHER): Payer: Medicare HMO | Admitting: Family Medicine

## 2023-03-21 ENCOUNTER — Encounter: Payer: Self-pay | Admitting: Family Medicine

## 2023-03-21 VITALS — BP 119/66 | HR 59 | Temp 97.0°F | Ht 68.0 in | Wt 171.0 lb

## 2023-03-21 DIAGNOSIS — D649 Anemia, unspecified: Secondary | ICD-10-CM | POA: Diagnosis not present

## 2023-03-21 DIAGNOSIS — N138 Other obstructive and reflux uropathy: Secondary | ICD-10-CM

## 2023-03-21 DIAGNOSIS — G4733 Obstructive sleep apnea (adult) (pediatric): Secondary | ICD-10-CM

## 2023-03-21 DIAGNOSIS — N401 Enlarged prostate with lower urinary tract symptoms: Secondary | ICD-10-CM

## 2023-03-21 DIAGNOSIS — R350 Frequency of micturition: Secondary | ICD-10-CM

## 2023-03-21 LAB — LIPID PANEL

## 2023-03-21 MED ORDER — TADALAFIL 10 MG PO TABS: 10.0000 mg | ORAL_TABLET | ORAL | 1 refills | Status: AC | PRN

## 2023-03-21 MED ORDER — FINASTERIDE 5 MG PO TABS
5.0000 mg | ORAL_TABLET | Freq: Every day | ORAL | 3 refills | Status: AC
Start: 2023-03-21 — End: ?

## 2023-03-21 MED ORDER — TAMSULOSIN HCL 0.4 MG PO CAPS: ORAL_CAPSULE | ORAL | 1 refills | Status: DC

## 2023-03-21 NOTE — Progress Notes (Signed)
 Subjective:  Patient ID: Michael Joseph, male    DOB: 1950/09/17  Age: 73 y.o. MRN: [id number]  CC: Medical Management of Chronic Issues (No concerns )   HPI Michael Joseph presents for bph, ELEVATED psa IN PAST. Denies any sx except nocturia X 1. Has cut back finasteride.  No side effects, just thought he'd see how it would work to take less. Tamsulosin  working well continues to take it regularly.  He experiences E.D. intermittently. Using cialis with success.      03/21/2023    8:50 AM 09/16/2022    8:06 AM 05/05/2022    8:22 AM  Depression screen PHQ 2/9  Decreased Interest 0 0 0  Down, Depressed, Hopeless 0 0 0  PHQ - 2 Score 0 0 0  Altered sleeping 0 0   Tired, decreased energy 0 0   Change in appetite 0 0   Feeling bad or failure about yourself  0 0   Trouble concentrating 0 0   Moving slowly or fidgety/restless 0 0   Suicidal thoughts 0 0   PHQ-9 Score 0 0   Difficult doing work/chores Not difficult at all      History Michael Joseph has a past medical history of Allergy, Complication of anesthesia, Environmental allergies, Obstructive sleep apnea, Personal history of colonic polyps - adenoma (08/28/2013), and Sleep apnea.   He has a past surgical history that includes Wrist surgery (Left); Nasal septum surgery (1987); Fracture surgery; Artery Biopsy (Left, 08/11/2021); and Colonoscopy.   His family history includes Diabetes in his mother; Healthy in his daughter and son.He reports that he quit smoking about 20 years ago. His smoking use included pipe. He has never been exposed to tobacco smoke. He has never used smokeless tobacco. He reports current alcohol use of about 2.0 standard drinks of alcohol per week. He reports that he does not use drugs.    ROS Review of Systems  Constitutional:  Negative for fever.  Respiratory:  Negative for shortness of breath.   Cardiovascular:  Negative for chest pain.  Musculoskeletal:  Negative for arthralgias.  Skin:  Negative for  rash.    Objective:  BP 119/66   Pulse (!) 59   Temp (!) 97 F (36.1 C)   Ht 5\' 8"  (1.727 m)   Wt 171 lb (77.6 kg)   SpO2 99%   BMI 26.00 kg/m   BP Readings from Last 3 Encounters:  03/21/23 119/66  09/16/22 (!) 140/74  03/17/22 114/60    Wt Readings from Last 3 Encounters:  03/21/23 171 lb (77.6 kg)  09/16/22 170 lb 2 oz (77.2 kg)  05/05/22 180 lb (81.6 kg)     Physical Exam Vitals reviewed.  Constitutional:      Appearance: He is well-developed.  HENT:     Head: Normocephalic and atraumatic.     Right Ear: External ear normal.     Left Ear: External ear normal.     Mouth/Throat:     Pharynx: No oropharyngeal exudate or posterior oropharyngeal erythema.  Eyes:     Pupils: Pupils are equal, round, and reactive to light.  Cardiovascular:     Rate and Rhythm: Normal rate and regular rhythm.     Heart sounds: No murmur heard. Pulmonary:     Effort: No respiratory distress.     Breath sounds: Normal breath sounds.  Musculoskeletal:     Cervical back: Normal range of motion and neck supple.  Neurological:     Mental Status:  He is alert and oriented to person, place, and time.       Assessment & Plan:   Michael Joseph was seen today for medical management of chronic issues.  Diagnoses and all orders for this visit:  Obstructive sleep apnea  BPH with obstruction/lower urinary tract symptoms -     CBC with Differential/Platelet -     CMP14+EGFR -     Lipid panel -     finasteride (PROSCAR) 5 MG tablet; Take 1 tablet (5 mg total) by mouth daily.  Benign prostatic hyperplasia with urinary frequency  Low hemoglobin  Other orders -     tamsulosin (FLOMAX) 0.4 MG CAPS capsule; TAKE 2 CAPSULES AT BEDTIME FOR URINE FLOW & PROSTATE -     tadalafil (CIALIS) 10 MG tablet; Take 1 tablet (10 mg total) by mouth every other day as needed for erectile dysfunction.       I am having Vira Browns maintain his fexofenadine-pseudoephedrine, multivitamin with  minerals, Ascorbic Acid (VITAMIN C PO), docusate sodium, Vitamin D (Ergocalciferol), fluticasone, finasteride, tamsulosin, and tadalafil.  Allergies as of 03/21/2023       Reactions   Amoxicillin Rash   Skin rash        Medication List        Accurate as of March 21, 2023 11:01 PM. If you have any questions, ask your nurse or doctor.          docusate sodium 100 MG capsule Commonly known as: COLACE Take 100 mg by mouth in the morning.   fexofenadine-pseudoephedrine 180-240 MG 24 hr tablet Commonly known as: ALLEGRA-D 24 Take 1 tablet by mouth every evening. For allergy and congestion What changed:  when to take this reasons to take this additional instructions   finasteride 5 MG tablet Commonly known as: PROSCAR Take 1 tablet (5 mg total) by mouth daily.   fluticasone 50 MCG/ACT nasal spray Commonly known as: FLONASE USE 2 SPRAYS IN EACH NOSTRIL ONCE DAILY.   multivitamin with minerals Tabs tablet Take 1 tablet by mouth in the morning.   tadalafil 10 MG tablet Commonly known as: CIALIS Take 1 tablet (10 mg total) by mouth every other day as needed for erectile dysfunction.   tamsulosin 0.4 MG Caps capsule Commonly known as: FLOMAX TAKE 2 CAPSULES AT BEDTIME FOR URINE FLOW & PROSTATE   VITAMIN C PO Take 1 tablet by mouth daily.   Vitamin D (Ergocalciferol) 1.25 MG (50000 UNIT) Caps capsule Commonly known as: DRISDOL Take 1 capsule (50,000 Units total) by mouth every 7 (seven) days.         Follow-up: Return in about 6 months (around 09/18/2023) for [employer] physical.  Mechele Claude, M.D.

## 2023-03-22 LAB — CBC WITH DIFFERENTIAL/PLATELET
Basophils Absolute: 0 10*3/uL (ref 0.0–0.2)
Basos: 1 %
EOS (ABSOLUTE): 0.2 10*3/uL (ref 0.0–0.4)
Eos: 4 %
Hematocrit: 46.5 % (ref 37.5–51.0)
Hemoglobin: 15.7 g/dL (ref 13.0–17.7)
Immature Grans (Abs): 0 10*3/uL (ref 0.0–0.1)
Immature Granulocytes: 0 %
Lymphocytes Absolute: 1.7 10*3/uL (ref 0.7–3.1)
Lymphs: 31 %
MCH: 28.5 pg (ref 26.6–33.0)
MCHC: 33.8 g/dL (ref 31.5–35.7)
MCV: 84 fL (ref 79–97)
Monocytes Absolute: 0.4 10*3/uL (ref 0.1–0.9)
Monocytes: 7 %
Neutrophils Absolute: 3.2 10*3/uL (ref 1.4–7.0)
Neutrophils: 57 %
Platelets: 265 10*3/uL (ref 150–450)
RBC: 5.51 x10E6/uL (ref 4.14–5.80)
RDW: 12.6 % (ref 11.6–15.4)
WBC: 5.5 10*3/uL (ref 3.4–10.8)

## 2023-03-22 LAB — LIPID PANEL
Cholesterol, Total: 197 mg/dL (ref 100–199)
HDL: 52 mg/dL (ref >39)
LDL CALC COMMENT:: 3.8 ratio (ref 0.0–5.0)
LDL Chol Calc (NIH): 128 mg/dL — ABNORMAL HIGH (ref 0–99)
Triglycerides: 96 mg/dL (ref 0–149)
VLDL Cholesterol Cal: 17 mg/dL (ref 5–40)

## 2023-03-22 LAB — CMP14+EGFR
ALT: 18 IU/L (ref 0–44)
AST: 22 IU/L (ref 0–40)
Albumin: 4.2 g/dL (ref 3.8–4.8)
Alkaline Phosphatase: 86 IU/L (ref 44–121)
BUN/Creatinine Ratio: 14 (ref 10–24)
BUN: 12 mg/dL (ref 8–27)
Bilirubin Total: 0.9 mg/dL (ref 0.0–1.2)
CO2: 22 mmol/L (ref 20–29)
Calcium: 9.5 mg/dL (ref 8.6–10.2)
Chloride: 103 mmol/L (ref 96–106)
Creatinine, Ser: 0.85 mg/dL (ref 0.76–1.27)
Globulin, Total: 2.1 g/dL (ref 1.5–4.5)
Glucose: 101 mg/dL — ABNORMAL HIGH (ref 70–99)
Potassium: 4.3 mmol/L (ref 3.5–5.2)
Sodium: 139 mmol/L (ref 134–144)
Total Protein: 6.3 g/dL (ref 6.0–8.5)
eGFR: 93 mL/min/{1.73_m2} (ref >59)

## 2023-03-24 ENCOUNTER — Encounter: Payer: Self-pay | Admitting: Family Medicine

## 2023-04-27 ENCOUNTER — Ambulatory Visit (INDEPENDENT_AMBULATORY_CARE_PROVIDER_SITE_OTHER)

## 2023-04-27 VITALS — BP 119/66 | HR 59 | Ht 68.0 in | Wt 171.0 lb

## 2023-04-27 DIAGNOSIS — Z0000 Encounter for general adult medical examination without abnormal findings: Secondary | ICD-10-CM | POA: Diagnosis not present

## 2023-04-27 NOTE — Progress Notes (Signed)
 Subjective:   Michael Joseph is a 73 y.o. who presents for a Medicare Wellness preventive visit.  Visit Complete: Virtual I connected with  Michael Joseph on 04/27/23 by a audio enabled telemedicine application and verified that I am speaking with the correct person using two identifiers.  Patient Location: Home  Provider Location: Home Office  I discussed the limitations of evaluation and management by telemedicine. The patient expressed understanding and agreed to proceed.  Vital Signs: Because this visit was a virtual/telehealth visit, some criteria may be missing or patient reported. Any vitals not documented were not able to be obtained and vitals that have been documented are patient reported.  VideoDeclined- This patient declined Librarian, academic. Therefore the visit was completed with audio only.  Persons Participating in Visit: Patient.  AWV Questionnaire: Yes: Patient Medicare AWV questionnaire was completed by the patient on 04/26/23; I have confirmed that all information answered by patient is correct and no changes since this date.        Objective:    Today's Vitals   04/27/23 1509  BP: 119/66  Pulse: (!) 59  Weight: 171 lb (77.6 kg)  Height: 5\' 8"  (1.727 m)   Body mass index is 26 kg/m.     04/27/2023    3:34 PM 05/05/2022    8:23 AM 08/11/2021    6:29 AM 07/16/2021    1:25 PM 07/12/2021   11:40 AM 03/30/2021    8:24 AM 08/13/2013    1:55 PM  Advanced Directives  Does Patient Have a Medical Advance Directive? Yes Yes Yes No Yes Yes Patient does not have advance directive  Type of Advance Directive Healthcare Power of [place];Living will Healthcare Power of [place];Living will Healthcare Power of [place];Living will  Healthcare Power of [place];Living will Healthcare Power of [place];Living will   Does patient want to make changes to medical advance directive?   No - Patient declined      Copy of Healthcare Power of Attorney  in Chart? No - copy requested No - copy requested No - copy requested   No - copy requested   Pre-existing out of facility DNR order (yellow form or pink MOST form)       No    Current Medications (verified) Outpatient Encounter Medications as of 04/27/2023  Medication Sig   Ascorbic Acid (VITAMIN C PO) Take 1 tablet by mouth daily.   docusate sodium (COLACE) 100 MG capsule Take 100 mg by mouth in the morning.   fexofenadine-pseudoephedrine (ALLEGRA-D 24) 180-240 MG 24 hr tablet Take 1 tablet by mouth every evening. For allergy and congestion (Patient taking differently: Take 1 tablet by mouth daily as needed (allergy/congestion.).)   finasteride (PROSCAR) 5 MG tablet Take 1 tablet (5 mg total) by mouth daily.   fluticasone (FLONASE) 50 MCG/ACT nasal spray USE 2 SPRAYS IN EACH NOSTRIL ONCE DAILY.   Multiple Vitamin (MULTIVITAMIN WITH MINERALS) TABS tablet Take 1 tablet by mouth in the morning.   tadalafil (CIALIS) 10 MG tablet Take 1 tablet (10 mg total) by mouth every other day as needed for erectile dysfunction.   tamsulosin (FLOMAX) 0.4 MG CAPS capsule TAKE 2 CAPSULES AT BEDTIME FOR URINE FLOW & PROSTATE   Vitamin D, Ergocalciferol, (DRISDOL) 1.25 MG (50000 UNIT) CAPS capsule Take 1 capsule (50,000 Units total) by mouth every 7 (seven) days.   No facility-administered encounter medications on file as of 04/27/2023.    Allergies (verified) Amoxicillin   History: Past Medical History:  Diagnosis Date   Allergy    Complication of anesthesia    Environmental allergies    Obstructive sleep apnea    Personal history of colonic polyps - adenoma 08/28/2013   Sleep apnea    Past Surgical History:  Procedure Laterality Date   ARTERY BIOPSY Left 08/11/2021   Procedure: TEMPORAL ARTERY BIOPSY;  Surgeon: Victorino Sparrow, MD;  Location: [facility] OR;  Service: Vascular;  Laterality: Left;   COLON SURGERY     COLONOSCOPY     EYE SURGERY     FRACTURE SURGERY     left wrist   NASAL SEPTUM SURGERY   1987   WRIST SURGERY Left    Family History  Problem Relation Age of Onset   Diabetes Mother    Healthy Daughter    Healthy Son    Colon cancer Neg Hx    Pancreatic cancer Neg Hx    Rectal cancer Neg Hx    Stomach cancer Neg Hx    Colon polyps Neg Hx    Esophageal cancer Neg Hx    Social History   Socioeconomic History   Marital status: Married    Spouse name: Darl Pikes   Number of children: 2   Years of education: Not on file   Highest education level: Associate degree: occupational, Scientist, product/process development, or vocational program  Occupational History   Not on file  Tobacco Use   Smoking status: Former    Current packs/day: 0.00    Types: Pipe, Cigarettes    Start date: 43    Quit date: 01/26/2003    Years since quitting: 20.2    Passive exposure: Never   Smokeless tobacco: Never   Tobacco comments:    short time and not enhaled.  Vaping Use   Vaping status: Never Used  Substance and Sexual Activity   Alcohol use: Yes    Alcohol/week: 2.0 standard drinks of alcohol    Types: 2 Cans of beer per week    Comment: occ   Drug use: No   Sexual activity: Yes    Birth control/protection: None  Other Topics Concern   Not on file  Social History Narrative   Married x 42 years.    1 grandchildren   Social Drivers of Corporate investment banker Strain: Low Risk  (04/27/2023)   Overall Financial Resource Strain (CARDIA)    Difficulty of Paying Living Expenses: Not hard at all  Food Insecurity: Patient Declined (04/27/2023)   Hunger Vital Sign    Worried About Running Out of Food in the Last Year: Patient declined    Ran Out of Food in the Last Year: Patient declined  Transportation Needs: No Transportation Needs (04/27/2023)   PRAPARE - Administrator, Civil Service (Medical): No    Lack of Transportation (Non-Medical): No  Physical Activity: Insufficiently Active (04/27/2023)   Exercise Vital Sign    Days of Exercise per Week: 2 days    Minutes of Exercise per Session: 70  min  Stress: No Stress Concern Present (04/27/2023)   [employer] of Occupational Health - Occupational Stress Questionnaire    Feeling of Stress : Not at all  Social Connections: Socially Integrated (04/27/2023)   Social Connection and Isolation Panel [NHANES]    Frequency of Communication with Friends and Family: More than three times a week    Frequency of Social Gatherings with Friends and Family: More than three times a week    Attends Religious Services: More than 4 times per  year    Active Member of Clubs or Organizations: Yes    Attends Banker Meetings: Never    Marital Status: Married    Tobacco Counseling Counseling given: Yes Tobacco comments: short time and not enhaled.    Clinical Intake:  Pre-visit preparation completed: Yes  Pain : No/denies pain     BMI - recorded: 26 Nutritional Status: BMI 25 -29 Overweight Nutritional Risks: None Diabetes: No  Lab Results  Component Value Date   HGBA1C 5.7 (H) 01/23/2016     How often do you need to have someone help you when you read instructions, pamphlets, or other written materials from your doctor or pharmacy?: 1 - Never  Interpreter Needed?: No  Information entered by :: Alia T/cma   Activities of Daily Living     04/26/2023    8:35 PM 05/05/2022    8:23 AM  In your present state of health, do you have any difficulty performing the following activities:  Hearing? 0 0  Vision? 0 0  Difficulty concentrating or making decisions? 0 0  Walking or climbing stairs? 0 0  Dressing or bathing? 0 0  Doing errands, shopping? 0 0  Preparing Food and eating ? N N  Using the Toilet? N N  In the past six months, have you accidently leaked urine? N N  Do you have problems with loss of bowel control? N N  Managing your Medications? N N  Managing your Finances? N N  Housekeeping or managing your Housekeeping? N N    Patient Care Team: Mechele Claude, MD as PCP - General (Family  Medicine)  Indicate any recent Medical Services you may have received from other than [facility] providers in the past year (date may be approximate).     Assessment:   This is a routine wellness examination for Michael Joseph.  Hearing/Vision screen Hearing Screening - Comments:: Pt denies hearing def Vision Screening - Comments:: Pt denies vision def Pt goes [facility] in [place]   Goals Addressed             This Visit's Progress    Exercise 3x per week (30 min per time)   On track    Continue to exercise, play tennis and golf. Do some house repairs.        Depression Screen     04/27/2023    3:37 PM 03/21/2023    8:50 AM 09/16/2022    8:06 AM 05/05/2022    8:22 AM 03/17/2022    8:04 AM 09/10/2021   10:04 AM 08/25/2021   11:12 AM  PHQ 2/9 Scores  PHQ - 2 Score 0 0 0 0 0 0 0  PHQ- 9 Score 0 0 0        Fall Risk     04/26/2023    8:35 PM 09/16/2022    8:05 AM 05/05/2022    8:21 AM 05/04/2022    9:16 PM 03/17/2022    8:04 AM  Fall Risk   Falls in the past year? 0  0 0 0  Number falls in past yr:   0 0   Injury with Fall?   0 0   Risk for fall due to :   No Fall Risks    Follow up  Falls evaluation completed Falls prevention discussed      MEDICARE RISK AT HOME:  Medicare Risk at Home Any stairs in or around the home?: (Patient-Rptd) Yes If so, are there any without handrails?: (Patient-Rptd)  No Home free of loose throw rugs in walkways, pet beds, electrical cords, etc?: (Patient-Rptd) Yes Adequate lighting in your home to reduce risk of falls?: (Patient-Rptd) Yes Life alert?: (Patient-Rptd) No Use of a cane, walker or w/c?: (Patient-Rptd) No Grab bars in the bathroom?: (Patient-Rptd) Yes Shower chair or bench in shower?: (Patient-Rptd) No Elevated toilet seat or a handicapped toilet?: (Patient-Rptd) No  TIMED UP AND GO:  Was the test performed?  No  Cognitive Function: 6CIT completed    10/10/2019    2:35 PM  MMSE - Mini Mental State Exam  Orientation to  time 5  Orientation to Place 5  Registration 3  Attention/ Calculation 5  Recall 3  Language- name 2 objects 2  Language- repeat 1  Language- follow 3 step command 3  Language- read & follow direction 1  Write a sentence 1  Copy design 1  Total score 30        05/05/2022    8:23 AM 03/30/2021    8:27 AM  6CIT Screen  What Year? 0 points 0 points  What month? 0 points 0 points  What time? 0 points 0 points  Count back from 20 0 points 0 points  Months in reverse 0 points 0 points  Repeat phrase 0 points 0 points  Total Score 0 points 0 points    Immunizations Immunization History  Administered Date(s) Administered   Hepatitis B 07/15/1999, 08/21/1999, 02/08/2000   MMR 08/11/1999   Tdap 10/10/2019    Screening Tests Health Maintenance  Topic Date Due   Zoster Vaccines- Shingrix (1 of 2) 06/18/2023 (Originally 08/10/1969)   Pneumonia Vaccine 106+ Years old (1 of 2 - PCV) 09/16/2023 (Originally 08/09/1956)   COVID-19 Vaccine (1) 05/12/2024 (Originally 08/09/1955)   INFLUENZA VACCINE  08/26/2023   Medicare Annual Wellness (AWV)  04/26/2024   DTaP/Tdap/Td (2 - Td or Tdap) 10/09/2029   Hepatitis C Screening  Completed   HPV VACCINES  Aged Out   Colonoscopy  Discontinued    Health Maintenance  There are no preventive care reminders to display for this patient.  Health Maintenance Items Addressed: See Nurse Notes  Additional Screening:  Vision Screening: Recommended annual ophthalmology exams for early detection of glaucoma and other disorders of the eye.  Dental Screening: Recommended annual dental exams for proper oral hygiene  Community Resource Referral / Chronic Care Management: CRR required this visit?  No   CCM required this visit?  No     Plan:     I have personally reviewed and noted the following in the patient's chart:   Medical and social history Use of alcohol, tobacco or illicit drugs  Current medications and supplements including opioid  prescriptions. Patient is not currently taking opioid prescriptions. Functional ability and status Nutritional status Physical activity Advanced directives List of other physicians Hospitalizations, surgeries, and ER visits in previous 12 months Vitals Screenings to include cognitive, depression, and falls Referrals and appointments  In addition, I have reviewed and discussed with patient certain preventive protocols, quality metrics, and best practice recommendations. A written personalized care plan for preventive services as well as general preventive health recommendations were provided to patient.     Arta Silence, CMA   04/27/2023   After Visit Summary: (MyChart) Due to this being a telephonic visit, the after visit summary with patients personalized plan was offered to patient via MyChart   Notes: Nothing significant to report at this time.

## 2023-04-27 NOTE — Patient Instructions (Signed)
 Mr. Michael Joseph , Thank you for taking time to come for your Medicare Wellness Visit. I appreciate your ongoing commitment to your health goals. Please review the following plan we discussed and let me know if I can assist you in the future.     This is a list of the screening recommended for you and due dates:  Health Maintenance  Topic Date Due   Zoster (Shingles) Vaccine (1 of 2) 06/18/2023*   Pneumonia Vaccine (1 of 2 - PCV) 09/16/2023*   COVID-19 Vaccine (1) 05/12/2024*   Flu Shot  08/26/2023   Medicare Annual Wellness Visit  04/26/2024   DTaP/Tdap/Td vaccine (2 - Td or Tdap) 10/09/2029   Hepatitis C Screening  Completed   HPV Vaccine  Aged Out   Colon Cancer Screening  Discontinued  *Topic was postponed. The date shown is not the original due date.    Advanced directives: (Copy Requested) Please bring a copy of your health care power of attorney and living will to the office to be added to your chart at your convenience. You can mail to [facility] [address] [place], [place] [address] or email to [email or web address]  Next Medicare Annual Wellness Visit scheduled for next year: Yes

## 2023-08-24 ENCOUNTER — Ambulatory Visit (INDEPENDENT_AMBULATORY_CARE_PROVIDER_SITE_OTHER)

## 2023-08-24 VITALS — BP 135/79 | HR 54 | Temp 97.5°F | Ht 68.0 in | Wt 176.8 lb

## 2023-08-24 DIAGNOSIS — H1013 Acute atopic conjunctivitis, bilateral: Secondary | ICD-10-CM | POA: Diagnosis not present

## 2023-08-24 MED ORDER — OLOPATADINE HCL 0.2 % OP SOLN: 1.0000 [drp] | Freq: Every day | OPHTHALMIC | 0 refills | Status: AC

## 2023-08-24 NOTE — Progress Notes (Signed)
 Subjective:  Patient ID: Michael Joseph, male    DOB: 1950-11-26, 73 y.o.   MRN: [id number]  Patient Care Team: Zollie Lowers, MD as PCP - General (Family Medicine)   Chief Complaint:  Conjunctivitis (Right x 4 days - draining a little )   HPI: OLAND Joseph is a 73 y.o. male presenting on 08/24/2023 for Conjunctivitis (Right x 4 days - draining a little )   Michael Joseph is a 73 year old male who presents with a bloodshot eye.  His right eye became bloodshot starting on Saturday, with significant redness initially that has since improved. No itching or burning sensations are present. He has been using an eye wash for both eyes.  No changes in vision or pain. He experiences a little bit of mucus. He has not used any allergy eye drops, but his wife has some Systane, which he identifies as a lubricating drop.  No symptoms such as cough, runny nose, or postnasal drip, but he mentions that his eyes feel puffy, possibly due to humidity. He does not wear contact lenses.  He does not take allergy pills regularly, only as needed.          Relevant past medical, surgical, family, and social history reviewed and updated as indicated.  Allergies and medications reviewed and updated. Data reviewed: Chart in Epic.   Past Medical History:  Diagnosis Date   Allergy    Complication of anesthesia    Environmental allergies    Obstructive sleep apnea    Personal history of colonic polyps - adenoma 08/28/2013   Sleep apnea     Past Surgical History:  Procedure Laterality Date   ARTERY BIOPSY Left 08/11/2021   Procedure: TEMPORAL ARTERY BIOPSY;  Surgeon: Lanis Fonda BRAVO, MD;  Location: [facility] OR;  Service: Vascular;  Laterality: Left;   COLON SURGERY     COLONOSCOPY     EYE SURGERY     FRACTURE SURGERY     left wrist   NASAL SEPTUM SURGERY  1987   WRIST SURGERY Left     Social History   Socioeconomic History   Marital status: Married    Spouse name: Devere   Number of  children: 2   Years of education: Not on file   Highest education level: Associate degree: occupational, Scientist, product/process development, or vocational program  Occupational History   Not on file  Tobacco Use   Smoking status: Former    Current packs/day: 0.00    Types: Pipe, Cigarettes    Start date: 40    Quit date: 01/26/2003    Years since quitting: 20.5    Passive exposure: Never   Smokeless tobacco: Never   Tobacco comments:    short time and not enhaled.  Vaping Use   Vaping status: Never Used  Substance and Sexual Activity   Alcohol use: Yes    Alcohol/week: 2.0 standard drinks of alcohol    Types: 2 Cans of beer per week    Comment: occ   Drug use: No   Sexual activity: Yes    Birth control/protection: None  Other Topics Concern   Not on file  Social History Narrative   Married x 42 years.    1 grandchildren   Social Drivers of Corporate investment banker Strain: Low Risk  (08/24/2023)   Overall Financial Resource Strain (CARDIA)    Difficulty of Paying Living Expenses: Not hard at all  Food Insecurity: No Food Insecurity (08/24/2023)  Hunger Vital Sign    Worried About Running Out of Food in the Last Year: Never true    Ran Out of Food in the Last Year: Never true  Transportation Needs: No Transportation Needs (08/24/2023)   PRAPARE - Administrator, Civil Service (Medical): No    Lack of Transportation (Non-Medical): No  Physical Activity: Insufficiently Active (08/24/2023)   Exercise Vital Sign    Days of Exercise per Week: 4 days    Minutes of Exercise per Session: 30 min  Stress: No Stress Concern Present (08/24/2023)   [employer] of Occupational Health - Occupational Stress Questionnaire    Feeling of Stress: Not at all  Social Connections: Socially Integrated (08/24/2023)   Social Connection and Isolation Panel    Frequency of Communication with Friends and Family: More than three times a week    Frequency of Social Gatherings with Friends and Family:  More than three times a week    Attends Religious Services: More than 4 times per year    Active Member of [employer] or Organizations: Yes    Attends Engineer, structural: More than 4 times per year    Marital Status: Married  Catering manager Violence: Not At Risk (04/27/2023)   Humiliation, Afraid, Rape, and Kick questionnaire    Fear of Current or Ex-Partner: No    Emotionally Abused: No    Physically Abused: No    Sexually Abused: No    Outpatient Encounter Medications as of 08/24/2023  Medication Sig   Ascorbic Acid (VITAMIN C PO) Take 1 tablet by mouth daily.   docusate sodium (COLACE) 100 MG capsule Take 100 mg by mouth in the morning.   fexofenadine -pseudoephedrine (ALLEGRA-D 24) 180-240 MG 24 hr tablet Take 1 tablet by mouth every evening. For allergy and congestion (Patient taking differently: Take 1 tablet by mouth daily as needed (allergy/congestion.).)   finasteride  (PROSCAR ) 5 MG tablet Take 1 tablet (5 mg total) by mouth daily.   fluticasone  (FLONASE ) 50 MCG/ACT nasal spray USE 2 SPRAYS IN EACH NOSTRIL ONCE DAILY.   Multiple Vitamin (MULTIVITAMIN WITH MINERALS) TABS tablet Take 1 tablet by mouth in the morning.   Olopatadine  HCl (PATADAY ) 0.2 % SOLN Apply 1 drop to eye daily.   tadalafil  (CIALIS ) 10 MG tablet Take 1 tablet (10 mg total) by mouth every other day as needed for erectile dysfunction.   tamsulosin  (FLOMAX ) 0.4 MG CAPS capsule TAKE 2 CAPSULES AT BEDTIME FOR URINE FLOW & PROSTATE   Vitamin D , Ergocalciferol , (DRISDOL ) 1.25 MG (50000 UNIT) CAPS capsule Take 1 capsule (50,000 Units total) by mouth every 7 (seven) days.   No facility-administered encounter medications on file as of 08/24/2023.    Allergies  Allergen Reactions   Amoxicillin Rash    Skin rash    Pertinent ROS per HPI, otherwise unremarkable      Objective:  BP 135/79   Pulse (!) 54   Temp (!) 97.5 F (36.4 C)   Ht 5' 8 (1.727 m)   Wt 176 lb 12.8 oz (80.2 kg)   SpO2 96%   BMI 26.88  kg/m    Wt Readings from Last 3 Encounters:  08/24/23 176 lb 12.8 oz (80.2 kg)  04/27/23 171 lb (77.6 kg)  03/21/23 171 lb (77.6 kg)    Physical Exam Vitals and nursing note reviewed.  Constitutional:      General: He is not in acute distress.    Appearance: Normal appearance. He is normal weight. He is  not ill-appearing, toxic-appearing or diaphoretic.  HENT:     Head: Normocephalic and atraumatic.     Nose: Nose normal.     Mouth/Throat:     Mouth: Mucous membranes are moist.  Eyes:     General: Lids are normal. Lids are everted, no foreign bodies appreciated. Vision grossly intact. Allergic shiner present. No visual field deficit or scleral icterus.       Right eye: No foreign body, discharge or hordeolum.        Left eye: No foreign body, discharge or hordeolum.     Extraocular Movements: Extraocular movements intact.     Conjunctiva/sclera:     Right eye: Right conjunctiva is injected. No exudate or hemorrhage.    Left eye: Left conjunctiva is injected. No exudate or hemorrhage.    Pupils: Pupils are equal, round, and reactive to light.  Cardiovascular:     Rate and Rhythm: Normal rate and regular rhythm.     Heart sounds: Normal heart sounds.  Pulmonary:     Effort: Pulmonary effort is normal.     Breath sounds: Normal breath sounds.  Musculoskeletal:     Cervical back: Neck supple.  Skin:    General: Skin is warm and dry.     Capillary Refill: Capillary refill takes less than 2 seconds.  Neurological:     General: No focal deficit present.     Mental Status: He is alert and oriented to person, place, and time.  Psychiatric:        Mood and Affect: Mood normal.        Behavior: Behavior normal.        Thought Content: Thought content normal.        Judgment: Judgment normal.     Results for orders placed or performed in visit on 03/21/23  CBC with Differential/Platelet   Collection Time: 03/21/23  9:22 AM  Result Value Ref Range   WBC 5.5 3.4 - 10.8  x10E3/uL   RBC 5.51 4.14 - 5.80 x10E6/uL   Hemoglobin 15.7 13.0 - 17.7 g/dL   Hematocrit 53.4 62.4 - 51.0 %   MCV 84 79 - 97 fL   MCH 28.5 26.6 - 33.0 pg   MCHC 33.8 31.5 - 35.7 g/dL   RDW 87.3 88.3 - 84.5 %   Platelets 265 150 - 450 x10E3/uL   Neutrophils 57 Not Estab. %   Lymphs 31 Not Estab. %   Monocytes 7 Not Estab. %   Eos 4 Not Estab. %   Basos 1 Not Estab. %   Neutrophils Absolute 3.2 1.4 - 7.0 x10E3/uL   Lymphocytes Absolute 1.7 0.7 - 3.1 x10E3/uL   Monocytes Absolute 0.4 0.1 - 0.9 x10E3/uL   EOS (ABSOLUTE) 0.2 0.0 - 0.4 x10E3/uL   Basophils Absolute 0.0 0.0 - 0.2 x10E3/uL   Immature Granulocytes 0 Not Estab. %   Immature Grans (Abs) 0.0 0.0 - 0.1 x10E3/uL  CMP14+EGFR   Collection Time: 03/21/23  9:22 AM  Result Value Ref Range   Glucose 101 (H) 70 - 99 mg/dL   BUN 12 8 - 27 mg/dL   Creatinine, Ser 9.14 0.76 - 1.27 mg/dL   eGFR 93 >40 fO/fpw/8.26   BUN/Creatinine Ratio 14 10 - 24   Sodium 139 134 - 144 mmol/L   Potassium 4.3 3.5 - 5.2 mmol/L   Chloride 103 96 - 106 mmol/L   CO2 22 20 - 29 mmol/L   Calcium 9.5 8.6 - 10.2 mg/dL   Total Protein 6.3  6.0 - 8.5 g/dL   Albumin 4.2 3.8 - 4.8 g/dL   Globulin, Total 2.1 1.5 - 4.5 g/dL   Bilirubin Total 0.9 0.0 - 1.2 mg/dL   Alkaline Phosphatase 86 44 - 121 IU/L   AST 22 0 - 40 IU/L   ALT 18 0 - 44 IU/L  Lipid panel   Collection Time: 03/21/23  9:22 AM  Result Value Ref Range   Cholesterol, Total 197 100 - 199 mg/dL   Triglycerides 96 0 - 149 mg/dL   HDL 52 >60 mg/dL   VLDL Cholesterol Cal 17 5 - 40 mg/dL   LDL Chol Calc (NIH) 871 (H) 0 - 99 mg/dL   Chol/HDL Ratio 3.8 0.0 - 5.0 ratio       Pertinent labs & imaging results that were available during my care of the patient were reviewed by me and considered in my medical decision making.  Assessment & Plan:  Bernie was seen today for conjunctivitis.  Diagnoses and all orders for this visit:  Allergic conjunctivitis of both eyes -     Olopatadine  HCl (PATADAY )  0.2 % SOLN; Apply 1 drop to eye daily.     Allergic conjunctivitis Allergic conjunctivitis presenting with redness, puffiness, and mucus discharge in the eyes. No significant itching, burning, or vision changes reported. Differential diagnosis includes bacterial conjunctivitis, but presentation is more consistent with allergic conjunctivitis. Allergic shiners noted under both eyes, and conjunctiva are red. No contact lens use reported. - Prescribe Pataday  eye drops, one drop in each eye daily for at least two weeks. - Advise taking an oral antihistamine nightly for the next couple of weeks. - Instruct to report any worsening symptoms, visual changes, or significant drainage. - Inform that Pataday  may cause temporary stinging upon application. - Advise that Pataday  is available over the counter if not covered by insurance. - Allow use of Systane lubricating drops as needed for eye comfort.          Continue all other maintenance medications.  Follow up plan: Return if symptoms worsen or fail to improve.   Continue healthy lifestyle choices, including diet (rich in fruits, vegetables, and lean proteins, and low in salt and simple carbohydrates) and exercise (at least 30 minutes of moderate physical activity daily).  Educational handout given for allergic conjunctivitis   The above assessment and management plan was discussed with the patient. The patient verbalized understanding of and has agreed to the management plan. Patient is aware to call the clinic if they develop any new symptoms or if symptoms persist or worsen. Patient is aware when to return to the clinic for a follow-up visit. Patient educated on when it is appropriate to go to the emergency department.   Rosaline Bruns, FNP-C [facility] [phone]

## 2023-09-20 ENCOUNTER — Encounter: Payer: Self-pay | Admitting: Family Medicine

## 2023-09-20 ENCOUNTER — Ambulatory Visit (INDEPENDENT_AMBULATORY_CARE_PROVIDER_SITE_OTHER): Payer: Medicare HMO | Admitting: Family Medicine

## 2023-09-20 VITALS — BP 122/69 | HR 50 | Temp 98.3°F | Ht 68.0 in | Wt 174.4 lb

## 2023-09-20 DIAGNOSIS — J302 Other seasonal allergic rhinitis: Secondary | ICD-10-CM | POA: Diagnosis not present

## 2023-09-20 DIAGNOSIS — R972 Elevated prostate specific antigen [PSA]: Secondary | ICD-10-CM

## 2023-09-20 DIAGNOSIS — R5383 Other fatigue: Secondary | ICD-10-CM

## 2023-09-20 DIAGNOSIS — N138 Other obstructive and reflux uropathy: Secondary | ICD-10-CM

## 2023-09-20 DIAGNOSIS — Z0001 Encounter for general adult medical examination with abnormal findings: Secondary | ICD-10-CM | POA: Diagnosis not present

## 2023-09-20 DIAGNOSIS — E559 Vitamin D deficiency, unspecified: Secondary | ICD-10-CM | POA: Diagnosis not present

## 2023-09-20 DIAGNOSIS — E782 Mixed hyperlipidemia: Secondary | ICD-10-CM

## 2023-09-20 DIAGNOSIS — N401 Enlarged prostate with lower urinary tract symptoms: Secondary | ICD-10-CM | POA: Diagnosis not present

## 2023-09-20 DIAGNOSIS — Z0000 Encounter for general adult medical examination without abnormal findings: Secondary | ICD-10-CM

## 2023-09-20 LAB — URINALYSIS
Bilirubin, UA: NEGATIVE
Glucose, UA: NEGATIVE
Ketones, UA: NEGATIVE
Leukocytes,UA: NEGATIVE
Nitrite, UA: NEGATIVE
Protein,UA: NEGATIVE
RBC, UA: NEGATIVE
Specific Gravity, UA: <1.005 — ABNORMAL LOW (ref 1.005–1.030)
Urobilinogen, Ur: 0.2 mg/dL (ref 0.2–1.0)
pH, UA: 6 (ref 5.0–7.5)

## 2023-09-20 NOTE — Progress Notes (Signed)
 Subjective:  Patient ID: Michael Joseph, male    DOB: August 29, 1950  Age: 73 y.o. MRN: [id number]  CC: Annual Exam   HPI  Discussed the use of AI scribe software for clinical note transcription with the patient, who gave verbal consent to proceed.  History of Present Illness Michael Joseph is a 73 year old male who presents for an annual physical exam.  He notes a general decline in endurance, particularly during physical activities such as tennis and golf. He can still play golf, riding rather than walking the course. He takes longer walks.  He experiences allergy symptoms, particularly with ragweed, including eye redness and puffiness. He uses Allegra D 24 as needed. He had an episode of allergic conjunctivitis a month ago, treated with over-the-counter eye drops, Pataday , as needed.  No chest pain, cough, or shortness of breath with activities. No belly pain, heartburn, indigestion, or diarrhea.  He wears a support brace on his right knee during activities like walking or playing tennis due to overextension issues, which is effective.  He is currently on tolcylen and finasteride , which have helped reduce nighttime urination. He gets up once or twice a night, which is an improvement.          09/20/2023    8:07 AM 04/27/2023    3:37 PM 03/21/2023    8:50 AM  Depression screen PHQ 2/9  Decreased Interest 0 0 0  Down, Depressed, Hopeless 0 0 0  PHQ - 2 Score 0 0 0  Altered sleeping  0 0  Tired, decreased energy  0 0  Change in appetite  0 0  Feeling bad or failure about yourself   0 0  Trouble concentrating  0 0  Moving slowly or fidgety/restless  0 0  Suicidal thoughts  0 0  PHQ-9 Score  0 0  Difficult doing work/chores  Not difficult at all Not difficult at all    History Michael Joseph has a past medical history of Allergy, Complication of anesthesia, Environmental allergies, Obstructive sleep apnea, Personal history of colonic polyps - adenoma (08/28/2013), and Sleep apnea.    He has a past surgical history that includes Wrist surgery (Left); Nasal septum surgery (1987); Fracture surgery; Artery Biopsy (Left, 08/11/2021); Colonoscopy; Eye surgery; and Colon surgery.   His family history includes Diabetes in his mother; Healthy in his daughter and son.He reports that he quit smoking about 20 years ago. His smoking use included pipe and cigarettes. He started smoking about 54 years ago. He has never been exposed to tobacco smoke. He has never used smokeless tobacco. He reports current alcohol use of about 2.0 standard drinks of alcohol per week. He reports that he does not use drugs.    ROS Review of Systems  Constitutional:  Negative for activity change, fatigue and unexpected weight change.  HENT:  Negative for congestion, ear pain, hearing loss, postnasal drip and trouble swallowing.   Eyes:  Negative for pain and visual disturbance.  Respiratory:  Negative for cough, chest tightness and shortness of breath.   Cardiovascular:  Negative for chest pain, palpitations and leg swelling.  Gastrointestinal:  Negative for abdominal distention, abdominal pain, blood in stool, constipation, diarrhea, nausea and vomiting.  Endocrine: Negative for cold intolerance, heat intolerance and polydipsia.  Genitourinary:  Negative for difficulty urinating, dysuria, flank pain, frequency and urgency.  Musculoskeletal:  Negative for arthralgias and joint swelling.  Skin:  Negative for color change, rash and wound.  Neurological:  Negative for dizziness, syncope,  speech difficulty, weakness, light-headedness, numbness and headaches.  Hematological:  Does not bruise/bleed easily.  Psychiatric/Behavioral:  Negative for confusion, decreased concentration, dysphoric mood and sleep disturbance. The patient is not nervous/anxious.     Objective:  BP 122/69   Pulse (!) 50   Temp 98.3 F (36.8 C)   Ht 5' 8 (1.727 m)   Wt 174 lb 6.4 oz (79.1 kg)   SpO2 99%   BMI 26.52 kg/m   BP  Readings from Last 3 Encounters:  09/20/23 122/69  08/24/23 135/79  04/27/23 119/66    Wt Readings from Last 3 Encounters:  09/20/23 174 lb 6.4 oz (79.1 kg)  08/24/23 176 lb 12.8 oz (80.2 kg)  04/27/23 171 lb (77.6 kg)     Physical Exam Constitutional:      Appearance: He is well-developed.  HENT:     Head: Normocephalic and atraumatic.  Eyes:     Pupils: Pupils are equal, round, and reactive to light.  Neck:     Thyroid : No thyromegaly.     Trachea: No tracheal deviation.  Cardiovascular:     Rate and Rhythm: Normal rate and regular rhythm.     Heart sounds: Normal heart sounds. No murmur heard.    No friction rub. No gallop.  Pulmonary:     Breath sounds: Normal breath sounds. No wheezing or rales.  Abdominal:     General: Bowel sounds are normal. There is no distension.     Palpations: Abdomen is soft. There is no mass.     Tenderness: There is no abdominal tenderness.     Hernia: There is no hernia in the left inguinal area.  Genitourinary:    Penis: Normal.      Testes: Normal.  Musculoskeletal:        General: Normal range of motion.     Cervical back: Normal range of motion.  Lymphadenopathy:     Cervical: No cervical adenopathy.  Skin:    General: Skin is warm and dry.  Neurological:     Mental Status: He is alert and oriented to person, place, and time.      Assessment & Plan:  Well adult exam -     CBC with Differential/Platelet -     CMP14+EGFR -     Lipid panel  Fatigue, unspecified type -     TSH -     Cortisol-am, blood  Vitamin D  deficiency  Elevated PSA -     PSA, total and free  BPH with obstruction/lower urinary tract symptoms -     CBC with Differential/Platelet -     CMP14+EGFR -     PSA, total and free -     Urinalysis  Seasonal allergies -     CBC with Differential/Platelet  Mixed hyperlipidemia -     Lipid panel    Assessment and Plan Assessment & Plan Adult Wellness Visit   Routine physical examination reveals  no new significant medical issues. He reports decreased endurance, likely age-related, but engages in tennis and golf with some limitations. He experiences no chest pain, cough, or shortness of breath and maintains regular dental visits. Order blood work to evaluate thyroid , anemia, and cortisol levels. Continue regular physical activity as tolerated and maintain a balanced diet while monitoring portion sizes.  Right knee pain with activity   He experiences right knee pain during physical activity, particularly tennis, which is managed with a support brace. There is no significant pain outside of activity. Continue using a support  brace during physical activity and monitor for increased frequency or severity of pain.  Benign prostatic hyperplasia with lower urinary tract symptoms   He is currently on tamsulosin  and finasteride , effectively reducing nocturia to once or twice per night. Symptoms are well-managed with the current medication regimen. Continue tamsulosin  and finasteride  as prescribed and monitor urinary symptoms, adjusting treatment if necessary.  Allergic rhinitis and allergic conjunctivitis   He experiences seasonal allergies, particularly to ragweed, with nasal congestion and eye irritation. He uses fexofenadine -pseudoephedrine as needed and is advised to take it regularly during allergy season. Previous allergic conjunctivitis was managed with Pataday  eye drops. Take fexofenadine -pseudoephedrine 180-240 MG orally every evening until the first hard frost and use Pataday  eye drops as needed for eye symptoms.  Viral warts, left knee   He has asymptomatic viral warts on the left knee. Consider treatment with cryotherapy if warts become bothersome.  Recording duration: 19 minutes       Follow-up: No follow-ups on file.  Butler Der, M.D.

## 2023-09-21 ENCOUNTER — Ambulatory Visit: Payer: Self-pay | Admitting: Family Medicine

## 2023-09-21 LAB — CBC WITH DIFFERENTIAL/PLATELET
Basophils Absolute: 0.1 x10E3/uL (ref 0.0–0.2)
Basos: 1 %
EOS (ABSOLUTE): 0.3 x10E3/uL (ref 0.0–0.4)
Eos: 5 %
Hematocrit: 47.5 % (ref 37.5–51.0)
Hemoglobin: 15.6 g/dL (ref 13.0–17.7)
Immature Grans (Abs): 0 x10E3/uL (ref 0.0–0.1)
Immature Granulocytes: 0 %
Lymphocytes Absolute: 1.7 x10E3/uL (ref 0.7–3.1)
Lymphs: 35 %
MCH: 28.6 pg (ref 26.6–33.0)
MCHC: 32.8 g/dL (ref 31.5–35.7)
MCV: 87 fL (ref 79–97)
Monocytes Absolute: 0.3 x10E3/uL (ref 0.1–0.9)
Monocytes: 7 %
Neutrophils Absolute: 2.5 x10E3/uL (ref 1.4–7.0)
Neutrophils: 52 %
Platelets: 236 x10E3/uL (ref 150–450)
RBC: 5.45 x10E6/uL (ref 4.14–5.80)
RDW: 12.8 % (ref 11.6–15.4)
WBC: 4.9 x10E3/uL (ref 3.4–10.8)

## 2023-09-21 LAB — CMP14+EGFR
ALT: 24 IU/L (ref 0–44)
AST: 26 IU/L (ref 0–40)
Albumin: 4.3 g/dL (ref 3.8–4.8)
Alkaline Phosphatase: 81 IU/L (ref 44–121)
BUN/Creatinine Ratio: 11 (ref 10–24)
BUN: 10 mg/dL (ref 8–27)
Bilirubin Total: 0.6 mg/dL (ref 0.0–1.2)
CO2: 23 mmol/L (ref 20–29)
Calcium: 9.2 mg/dL (ref 8.6–10.2)
Chloride: 104 mmol/L (ref 96–106)
Creatinine, Ser: 0.89 mg/dL (ref 0.76–1.27)
Globulin, Total: 2.1 g/dL (ref 1.5–4.5)
Glucose: 109 mg/dL — ABNORMAL HIGH (ref 70–99)
Potassium: 4.6 mmol/L (ref 3.5–5.2)
Sodium: 140 mmol/L (ref 134–144)
Total Protein: 6.4 g/dL (ref 6.0–8.5)
eGFR: 91 mL/min/1.73 (ref >59)

## 2023-09-21 LAB — PSA, TOTAL AND FREE
PSA, Free Pct: 15.3
PSA, Free: 0.29 ng/mL
Prostate Specific Ag, Serum: 1.9 ng/mL (ref 0.0–4.0)

## 2023-09-21 LAB — LIPID PANEL
Chol/HDL Ratio: 3.9 ratio (ref 0.0–5.0)
Cholesterol, Total: 189 mg/dL (ref 100–199)
HDL: 48 mg/dL (ref >39)
LDL Chol Calc (NIH): 123 mg/dL — ABNORMAL HIGH (ref 0–99)
Triglycerides: 98 mg/dL (ref 0–149)
VLDL Cholesterol Cal: 18 mg/dL (ref 5–40)

## 2023-09-21 LAB — CORTISOL-AM, BLOOD: Cortisol - AM: 6.8 ug/dL (ref 6.2–19.4)

## 2023-09-21 LAB — TSH: TSH: 1.46 u[IU]/mL (ref 0.450–4.500)

## 2023-09-21 NOTE — Progress Notes (Signed)
 Hello Seab,    Your lab result is normal and/or stable.Some minor variations that are not significant are commonly marked abnormal, but do not represent any medical problem for you.  Only the cortisol level is remaining to come in.  Will notify you separately when those results are received.  Best regards, Butler Der, M.D.

## 2023-09-21 NOTE — Progress Notes (Signed)
Hello Michael Joseph,  Your lab result is normal and/or stable.Some minor variations that are not significant are commonly marked abnormal, but do not represent any medical problem for you.  Best regards, Claretta Fraise, M.D.

## 2023-09-29 DIAGNOSIS — M62830 Muscle spasm of back: Secondary | ICD-10-CM | POA: Diagnosis not present

## 2023-09-29 DIAGNOSIS — M9901 Segmental and somatic dysfunction of cervical region: Secondary | ICD-10-CM | POA: Diagnosis not present

## 2023-09-29 DIAGNOSIS — M9903 Segmental and somatic dysfunction of lumbar region: Secondary | ICD-10-CM | POA: Diagnosis not present

## 2023-09-29 DIAGNOSIS — M9902 Segmental and somatic dysfunction of thoracic region: Secondary | ICD-10-CM | POA: Diagnosis not present

## 2023-10-03 DIAGNOSIS — M9903 Segmental and somatic dysfunction of lumbar region: Secondary | ICD-10-CM | POA: Diagnosis not present

## 2023-10-03 DIAGNOSIS — M9902 Segmental and somatic dysfunction of thoracic region: Secondary | ICD-10-CM | POA: Diagnosis not present

## 2023-10-03 DIAGNOSIS — M9901 Segmental and somatic dysfunction of cervical region: Secondary | ICD-10-CM | POA: Diagnosis not present

## 2023-10-03 DIAGNOSIS — M62830 Muscle spasm of back: Secondary | ICD-10-CM | POA: Diagnosis not present

## 2023-10-06 DIAGNOSIS — M62830 Muscle spasm of back: Secondary | ICD-10-CM | POA: Diagnosis not present

## 2023-10-06 DIAGNOSIS — M9903 Segmental and somatic dysfunction of lumbar region: Secondary | ICD-10-CM | POA: Diagnosis not present

## 2023-10-06 DIAGNOSIS — M9902 Segmental and somatic dysfunction of thoracic region: Secondary | ICD-10-CM | POA: Diagnosis not present

## 2023-10-06 DIAGNOSIS — M9901 Segmental and somatic dysfunction of cervical region: Secondary | ICD-10-CM | POA: Diagnosis not present

## 2023-12-29 ENCOUNTER — Other Ambulatory Visit: Payer: Self-pay | Admitting: Family Medicine

## 2024-03-22 ENCOUNTER — Ambulatory Visit: Payer: Self-pay | Admitting: Family Medicine

## 2024-04-27 ENCOUNTER — Encounter

## 2024-05-01 ENCOUNTER — Ambulatory Visit: Payer: Self-pay

## 2024-09-24 ENCOUNTER — Encounter: Payer: Self-pay | Admitting: Family Medicine
